# Patient Record
Sex: Male | Born: 1955 | Race: White | Hispanic: No | Marital: Married | Smoking: Never smoker
Health system: Southern US, Community
[De-identification: ages and names within clinical notes are randomized; demographics above are authoritative.]

## PROBLEM LIST (undated history)

## (undated) DIAGNOSIS — N138 Other obstructive and reflux uropathy: Secondary | ICD-10-CM

## (undated) DIAGNOSIS — I10 Essential (primary) hypertension: Secondary | ICD-10-CM

## (undated) DIAGNOSIS — G459 Transient cerebral ischemic attack, unspecified: Secondary | ICD-10-CM

## (undated) DIAGNOSIS — L989 Disorder of the skin and subcutaneous tissue, unspecified: Secondary | ICD-10-CM

## (undated) DIAGNOSIS — J309 Allergic rhinitis, unspecified: Secondary | ICD-10-CM

## (undated) DIAGNOSIS — M25569 Pain in unspecified knee: Secondary | ICD-10-CM

## (undated) DIAGNOSIS — T7840XA Allergy, unspecified, initial encounter: Secondary | ICD-10-CM

## (undated) DIAGNOSIS — F528 Other sexual dysfunction not due to a substance or known physiological condition: Secondary | ICD-10-CM

## (undated) DIAGNOSIS — E785 Hyperlipidemia, unspecified: Secondary | ICD-10-CM

## (undated) DIAGNOSIS — N401 Enlarged prostate with lower urinary tract symptoms: Secondary | ICD-10-CM

## (undated) DIAGNOSIS — H109 Unspecified conjunctivitis: Secondary | ICD-10-CM

## (undated) DIAGNOSIS — R972 Elevated prostate specific antigen [PSA]: Secondary | ICD-10-CM

## (undated) DIAGNOSIS — N764 Abscess of vulva: Secondary | ICD-10-CM

## (undated) DIAGNOSIS — F419 Anxiety disorder, unspecified: Secondary | ICD-10-CM

## (undated) DIAGNOSIS — R39198 Other difficulties with micturition: Secondary | ICD-10-CM

## (undated) HISTORY — DX: Pain in unspecified knee: M25.569

## (undated) HISTORY — DX: Other difficulties with micturition: R39.198

## (undated) HISTORY — PX: PROSTATE BIOPSY: SHX241

## (undated) HISTORY — PX: GANGLION CYST EXCISION: SHX1691

## (undated) HISTORY — PX: TONSILLECTOMY: SHX5217

## (undated) HISTORY — DX: Other obstructive and reflux uropathy: N13.8

## (undated) HISTORY — DX: Essential (primary) hypertension: I10

## (undated) HISTORY — DX: Transient cerebral ischemic attack, unspecified: G45.9

## (undated) HISTORY — PX: VASECTOMY: SHX75

## (undated) HISTORY — DX: Hyperlipidemia, unspecified: E78.5

## (undated) HISTORY — DX: Abscess of vulva: N76.4

## (undated) HISTORY — DX: Elevated prostate specific antigen (PSA): R97.20

## (undated) HISTORY — DX: Allergic rhinitis, unspecified: J30.9

## (undated) HISTORY — DX: Anxiety disorder, unspecified: F41.9

## (undated) HISTORY — DX: Allergy, unspecified, initial encounter: T78.40XA

## (undated) HISTORY — DX: Unspecified conjunctivitis: H10.9

## (undated) HISTORY — DX: Benign prostatic hyperplasia with lower urinary tract symptoms: N40.1

## (undated) HISTORY — DX: Disorder of the skin and subcutaneous tissue, unspecified: L98.9

## (undated) HISTORY — DX: Other sexual dysfunction not due to a substance or known physiological condition: F52.8

---

## 1995-06-23 HISTORY — PX: NASAL SINUS SURGERY: SHX719

## 1998-06-06 ENCOUNTER — Other Ambulatory Visit: Admission: RE | Admit: 1998-06-06 | Discharge: 1998-06-06 | Payer: Self-pay | Admitting: Orthopedic Surgery

## 1998-10-01 ENCOUNTER — Encounter: Payer: Self-pay | Admitting: Internal Medicine

## 1998-10-01 ENCOUNTER — Ambulatory Visit (HOSPITAL_COMMUNITY): Admission: RE | Admit: 1998-10-01 | Discharge: 1998-10-01 | Payer: Self-pay | Admitting: Internal Medicine

## 2004-02-06 ENCOUNTER — Ambulatory Visit (HOSPITAL_COMMUNITY): Admission: RE | Admit: 2004-02-06 | Discharge: 2004-02-06 | Payer: Self-pay | Admitting: Internal Medicine

## 2004-04-16 ENCOUNTER — Ambulatory Visit: Payer: Self-pay | Admitting: Internal Medicine

## 2004-08-07 ENCOUNTER — Ambulatory Visit: Payer: Self-pay | Admitting: Internal Medicine

## 2005-12-04 ENCOUNTER — Ambulatory Visit: Payer: Self-pay | Admitting: Internal Medicine

## 2005-12-10 ENCOUNTER — Ambulatory Visit: Payer: Self-pay | Admitting: Internal Medicine

## 2005-12-29 ENCOUNTER — Ambulatory Visit: Payer: Self-pay | Admitting: Internal Medicine

## 2006-09-15 ENCOUNTER — Ambulatory Visit: Payer: Self-pay | Admitting: Internal Medicine

## 2006-09-15 LAB — CONVERTED CEMR LAB
ALT: 19 units/L (ref 0–40)
AST: 20 units/L (ref 0–37)
Albumin: 3.8 g/dL (ref 3.5–5.2)
Alkaline Phosphatase: 40 units/L (ref 39–117)
BUN: 11 mg/dL (ref 6–23)
Basophils Absolute: 0.1 10*3/uL (ref 0.0–0.1)
Basophils Relative: 0.7 % (ref 0.0–1.0)
Bilirubin Urine: NEGATIVE
Bilirubin, Direct: 0.1 mg/dL (ref 0.0–0.3)
CO2: 31 meq/L (ref 19–32)
Calcium: 9.1 mg/dL (ref 8.4–10.5)
Chloride: 109 meq/L (ref 96–112)
Cholesterol: 193 mg/dL (ref 0–200)
Creatinine, Ser: 0.9 mg/dL (ref 0.4–1.5)
Crystals: NEGATIVE
Eosinophils Absolute: 0.2 10*3/uL (ref 0.0–0.6)
Eosinophils Relative: 2.4 % (ref 0.0–5.0)
GFR calc Af Amer: 115 mL/min
GFR calc non Af Amer: 95 mL/min
Glucose, Bld: 105 mg/dL — ABNORMAL HIGH (ref 70–99)
HCT: 41.9 % (ref 39.0–52.0)
HDL: 49 mg/dL (ref >39.0)
Hemoglobin: 14.7 g/dL (ref 13.0–17.0)
Ketones, ur: NEGATIVE mg/dL
LDL Cholesterol: 131 mg/dL — ABNORMAL HIGH (ref 0–99)
Lymphocytes Relative: 19.6 % (ref 12.0–46.0)
MCHC: 35 g/dL (ref 30.0–36.0)
MCV: 94.9 fL (ref 78.0–100.0)
Monocytes Absolute: 0.7 10*3/uL (ref 0.2–0.7)
Monocytes Relative: 7.7 % (ref 3.0–11.0)
Mucus, UA: NEGATIVE
Neutro Abs: 6.5 10*3/uL (ref 1.4–7.7)
Neutrophils Relative %: 69.6 % (ref 43.0–77.0)
Nitrite: NEGATIVE
PSA: 3.4 ng/mL (ref 0.10–4.00)
Platelets: 258 10*3/uL (ref 150–400)
Potassium: 4 meq/L (ref 3.5–5.1)
RBC: 4.42 M/uL (ref 4.22–5.81)
RDW: 12.1 % (ref 11.5–14.6)
Sodium: 145 meq/L (ref 135–145)
Specific Gravity, Urine: 1.02 (ref 1.000–1.03)
Squamous Epithelial / HPF: NEGATIVE /lpf
TSH: 0.95 microintl units/mL (ref 0.35–5.50)
Total Bilirubin: 1.1 mg/dL (ref 0.3–1.2)
Total CHOL/HDL Ratio: 3.9
Total Protein, Urine: NEGATIVE mg/dL
Total Protein: 6.4 g/dL (ref 6.0–8.3)
Triglycerides: 67 mg/dL (ref 0–149)
Urine Glucose: NEGATIVE mg/dL
Urobilinogen, UA: 0.2 (ref 0.0–1.0)
VLDL: 13 mg/dL (ref 0–40)
WBC: 9.3 10*3/uL (ref 4.5–10.5)
pH: 6 (ref 5.0–8.0)

## 2006-09-21 ENCOUNTER — Ambulatory Visit: Payer: Self-pay | Admitting: Internal Medicine

## 2006-11-01 ENCOUNTER — Ambulatory Visit: Payer: Self-pay | Admitting: Internal Medicine

## 2006-12-02 ENCOUNTER — Ambulatory Visit: Payer: Self-pay | Admitting: Internal Medicine

## 2006-12-02 HISTORY — PX: COLONOSCOPY: SHX174

## 2006-12-02 LAB — HM COLONOSCOPY

## 2007-02-15 ENCOUNTER — Encounter: Payer: Self-pay | Admitting: Internal Medicine

## 2007-02-15 DIAGNOSIS — J309 Allergic rhinitis, unspecified: Secondary | ICD-10-CM | POA: Insufficient documentation

## 2007-02-15 HISTORY — DX: Allergic rhinitis, unspecified: J30.9

## 2007-11-03 ENCOUNTER — Ambulatory Visit: Payer: Self-pay | Admitting: Internal Medicine

## 2007-11-03 LAB — CONVERTED CEMR LAB
ALT: 18 units/L (ref 0–53)
AST: 20 units/L (ref 0–37)
Albumin: 4.3 g/dL (ref 3.5–5.2)
Alkaline Phosphatase: 47 units/L (ref 39–117)
BUN: 10 mg/dL (ref 6–23)
Basophils Absolute: 0.1 10*3/uL (ref 0.0–0.1)
Basophils Relative: 1 % (ref 0.0–1.0)
Bilirubin Urine: NEGATIVE
Bilirubin, Direct: 0.1 mg/dL (ref 0.0–0.3)
CO2: 31 meq/L (ref 19–32)
Calcium: 9.3 mg/dL (ref 8.4–10.5)
Chloride: 106 meq/L (ref 96–112)
Cholesterol: 132 mg/dL (ref 0–200)
Creatinine, Ser: 0.9 mg/dL (ref 0.4–1.5)
Eosinophils Absolute: 0.2 10*3/uL (ref 0.0–0.7)
Eosinophils Relative: 2.1 % (ref 0.0–5.0)
GFR calc Af Amer: 114 mL/min
GFR calc non Af Amer: 95 mL/min
Glucose, Bld: 109 mg/dL — ABNORMAL HIGH (ref 70–99)
HCT: 44.1 % (ref 39.0–52.0)
HDL: 41.9 mg/dL (ref >39.0)
Hemoglobin, Urine: NEGATIVE
Hemoglobin: 14.9 g/dL (ref 13.0–17.0)
Ketones, ur: NEGATIVE mg/dL
LDL Cholesterol: 81 mg/dL (ref 0–99)
Leukocytes, UA: NEGATIVE
Lymphocytes Relative: 25.6 % (ref 12.0–46.0)
MCHC: 33.7 g/dL (ref 30.0–36.0)
MCV: 95.2 fL (ref 78.0–100.0)
Monocytes Absolute: 0.6 10*3/uL (ref 0.1–1.0)
Monocytes Relative: 8.2 % (ref 3.0–12.0)
Neutro Abs: 5 10*3/uL (ref 1.4–7.7)
Neutrophils Relative %: 63.1 % (ref 43.0–77.0)
Nitrite: NEGATIVE
PSA: 4.5 ng/mL — ABNORMAL HIGH (ref 0.10–4.00)
Platelets: 236 10*3/uL (ref 150–400)
Potassium: 4.5 meq/L (ref 3.5–5.1)
RBC: 4.63 M/uL (ref 4.22–5.81)
RDW: 12.2 % (ref 11.5–14.6)
Sodium: 142 meq/L (ref 135–145)
Specific Gravity, Urine: 1.015 (ref 1.000–1.03)
TSH: 0.89 microintl units/mL (ref 0.35–5.50)
Total Bilirubin: 1.2 mg/dL (ref 0.3–1.2)
Total CHOL/HDL Ratio: 3.2
Total Protein, Urine: NEGATIVE mg/dL
Total Protein: 6.8 g/dL (ref 6.0–8.3)
Triglycerides: 48 mg/dL (ref 0–149)
Urine Glucose: NEGATIVE mg/dL
Urobilinogen, UA: 1 (ref 0.0–1.0)
VLDL: 10 mg/dL (ref 0–40)
WBC: 7.9 10*3/uL (ref 4.5–10.5)
pH: 7 (ref 5.0–8.0)

## 2007-11-08 ENCOUNTER — Ambulatory Visit: Payer: Self-pay | Admitting: Internal Medicine

## 2007-11-08 DIAGNOSIS — E785 Hyperlipidemia, unspecified: Secondary | ICD-10-CM

## 2007-11-08 DIAGNOSIS — R972 Elevated prostate specific antigen [PSA]: Secondary | ICD-10-CM | POA: Insufficient documentation

## 2007-11-08 HISTORY — DX: Elevated prostate specific antigen (PSA): R97.20

## 2007-11-08 HISTORY — DX: Hyperlipidemia, unspecified: E78.5

## 2008-01-30 ENCOUNTER — Encounter: Payer: Self-pay | Admitting: Internal Medicine

## 2008-02-24 ENCOUNTER — Encounter: Payer: Self-pay | Admitting: Internal Medicine

## 2008-09-04 ENCOUNTER — Ambulatory Visit: Payer: Self-pay | Admitting: Internal Medicine

## 2008-09-04 DIAGNOSIS — R39198 Other difficulties with micturition: Secondary | ICD-10-CM | POA: Insufficient documentation

## 2008-09-04 DIAGNOSIS — H109 Unspecified conjunctivitis: Secondary | ICD-10-CM | POA: Insufficient documentation

## 2008-09-04 HISTORY — DX: Unspecified conjunctivitis: H10.9

## 2008-09-04 HISTORY — DX: Other difficulties with micturition: R39.198

## 2008-09-05 LAB — CONVERTED CEMR LAB

## 2008-09-26 ENCOUNTER — Ambulatory Visit: Payer: Self-pay | Admitting: Internal Medicine

## 2008-09-26 DIAGNOSIS — F528 Other sexual dysfunction not due to a substance or known physiological condition: Secondary | ICD-10-CM | POA: Insufficient documentation

## 2008-09-26 HISTORY — DX: Other sexual dysfunction not due to a substance or known physiological condition: F52.8

## 2009-11-27 ENCOUNTER — Ambulatory Visit: Payer: Self-pay | Admitting: Internal Medicine

## 2009-11-27 DIAGNOSIS — N764 Abscess of vulva: Secondary | ICD-10-CM

## 2009-11-27 HISTORY — DX: Abscess of vulva: N76.4

## 2009-11-27 LAB — CONVERTED CEMR LAB
ALT: 20 units/L (ref 0–53)
AST: 22 units/L (ref 0–37)
Albumin: 4.4 g/dL (ref 3.5–5.2)
Alkaline Phosphatase: 43 units/L (ref 39–117)
BUN: 16 mg/dL (ref 6–23)
Basophils Absolute: 0 10*3/uL (ref 0.0–0.1)
Basophils Relative: 0.6 % (ref 0.0–3.0)
Bilirubin Urine: NEGATIVE
Bilirubin, Direct: 0.2 mg/dL (ref 0.0–0.3)
CO2: 32 meq/L (ref 19–32)
Calcium: 9.5 mg/dL (ref 8.4–10.5)
Chloride: 103 meq/L (ref 96–112)
Cholesterol: 164 mg/dL (ref 0–200)
Creatinine, Ser: 0.9 mg/dL (ref 0.4–1.5)
Eosinophils Absolute: 0.2 10*3/uL (ref 0.0–0.7)
Eosinophils Relative: 3.9 % (ref 0.0–5.0)
GFR calc non Af Amer: 97.5 mL/min (ref >60)
Glucose, Bld: 93 mg/dL (ref 70–99)
HCT: 43 % (ref 39.0–52.0)
HDL: 51.9 mg/dL (ref >39.00)
Hemoglobin, Urine: NEGATIVE
Hemoglobin: 15.3 g/dL (ref 13.0–17.0)
Ketones, ur: NEGATIVE mg/dL
LDL Cholesterol: 104 mg/dL — ABNORMAL HIGH (ref 0–99)
Lymphocytes Relative: 32.5 % (ref 12.0–46.0)
Lymphs Abs: 2 10*3/uL (ref 0.7–4.0)
MCHC: 35.5 g/dL (ref 30.0–36.0)
MCV: 96 fL (ref 78.0–100.0)
Monocytes Absolute: 0.6 10*3/uL (ref 0.1–1.0)
Monocytes Relative: 9.3 % (ref 3.0–12.0)
Neutro Abs: 3.4 10*3/uL (ref 1.4–7.7)
Neutrophils Relative %: 53.7 % (ref 43.0–77.0)
Nitrite: NEGATIVE
PSA: 5.45 ng/mL — ABNORMAL HIGH (ref 0.10–4.00)
Platelets: 204 10*3/uL (ref 150.0–400.0)
Potassium: 5 meq/L (ref 3.5–5.1)
RBC: 4.48 M/uL (ref 4.22–5.81)
RDW: 12.6 % (ref 11.5–14.6)
Sodium: 140 meq/L (ref 135–145)
Specific Gravity, Urine: 1.01 (ref 1.000–1.030)
TSH: 1.23 microintl units/mL (ref 0.35–5.50)
Total Bilirubin: 1.2 mg/dL (ref 0.3–1.2)
Total CHOL/HDL Ratio: 3
Total Protein, Urine: NEGATIVE mg/dL
Total Protein: 6.7 g/dL (ref 6.0–8.3)
Triglycerides: 42 mg/dL (ref 0.0–149.0)
Urine Glucose: NEGATIVE mg/dL
Urobilinogen, UA: 0.2 (ref 0.0–1.0)
VLDL: 8.4 mg/dL (ref 0.0–40.0)
WBC: 6.3 10*3/uL (ref 4.5–10.5)
pH: 7.5 (ref 5.0–8.0)

## 2009-11-29 ENCOUNTER — Ambulatory Visit: Payer: Self-pay | Admitting: Internal Medicine

## 2009-11-29 DIAGNOSIS — L989 Disorder of the skin and subcutaneous tissue, unspecified: Secondary | ICD-10-CM

## 2009-11-29 DIAGNOSIS — M25569 Pain in unspecified knee: Secondary | ICD-10-CM

## 2009-11-29 HISTORY — DX: Disorder of the skin and subcutaneous tissue, unspecified: L98.9

## 2009-11-29 HISTORY — DX: Pain in unspecified knee: M25.569

## 2009-12-30 ENCOUNTER — Telehealth: Payer: Self-pay | Admitting: Internal Medicine

## 2010-01-03 ENCOUNTER — Encounter: Payer: Self-pay | Admitting: Internal Medicine

## 2010-01-23 ENCOUNTER — Encounter: Payer: Self-pay | Admitting: Internal Medicine

## 2010-07-22 NOTE — Letter (Signed)
Summary: [facility]   Imported By: Lester [place] 01/08/2010 08:23:19  _____________________________________________________________________  External Attachment:    Type:   Image     Comment:   External Document

## 2010-07-22 NOTE — Letter (Signed)
Summary: [facility]   Imported By: Sherian Rein 01/29/2010 10:57:37  _____________________________________________________________________  External Attachment:    Type:   Image     Comment:   External Document

## 2010-07-22 NOTE — Progress Notes (Signed)
Summary: rx's  Phone Note Call from Patient Call back at Home Phone [phone] Call back at Work Phone [phone]   Summary of Call: Patient left message on triage that he lost the prescriptions given at [facility] 2 weeks ago. Patient would like to pick up new ones. Please advise. Initial call taken by: Lucious Groves,  December 30, 2009 4:29 PM  Follow-up for Phone Call        ok for all, as there are no controlled substances - to [place] to [place] Follow-up by: Corwin Levins MD,  December 30, 2009 5:24 PM    Prescriptions: PRAVACHOL 20 MG  TABS (PRAVASTATIN SODIUM) 1 by mouth once daily  #90 x 3   Entered by:   Zella Ball Ewing CMA (AAMA)   Authorized by:   Corwin Levins MD   Signed by:   Scharlene Gloss CMA (AAMA) on 12/31/2009   Method used:   Print then Give to Patient   RxID:   [id number] MELOXICAM 15 MG TABS (MELOXICAM) 1 by mouth at bedtime  #90 x 3   Entered by:   Zella Ball Ewing CMA (AAMA)   Authorized by:   Corwin Levins MD   Signed by:   Scharlene Gloss CMA (AAMA) on 12/31/2009   Method used:   Print then Give to Patient   RxID:   [id number] CIALIS 20 MG TABS (TADALAFIL) 1po every other day as needed  #90 x 3   Entered by:   Zella Ball Ewing CMA (AAMA)   Authorized by:   Corwin Levins MD   Signed by:   Scharlene Gloss CMA (AAMA) on 12/31/2009   Method used:   Print then Give to Patient   RxID:   [id number] CETIRIZINE HCL 10 MG TABS (CETIRIZINE HCL) 1 by mouth once daily as needed  #90 x 3   Entered by:   Zella Ball Ewing CMA (AAMA)   Authorized by:   Corwin Levins MD   Signed by:   Scharlene Gloss CMA (AAMA) on 12/31/2009   Method used:   Print then Give to Patient   RxID:   [id number] TAMSULOSIN HCL 0.4 MG CAPS (TAMSULOSIN HCL) 1 by mouth once daily  #90 x 3   Entered by:   Zella Ball Ewing CMA (AAMA)   Authorized by:   Corwin Levins MD   Signed by:   Scharlene Gloss CMA (AAMA) on 12/31/2009   Method used:   Print then Give to Patient   RxID:   [id number] ACYCLOVIR 200 MG  CAPS (ACYCLOVIR)  1 by mouth two times a day  #180 x 3   Entered by:   Zella Ball Ewing CMA (AAMA)   Authorized by:   Corwin Levins MD   Signed by:   Scharlene Gloss CMA (AAMA) on 12/31/2009   Method used:   Print then Give to Patient   RxID:   [id number]   Appended Document: rx's called pt. at home number and work number, left msg. to call back and informed prescriptions ready for pickup.  Appended Document: rx's called pt left msg. to call back  Appended Document: rx's called pt informed prescriptions are ready to be picked up at the front desk.

## 2010-07-22 NOTE — Assessment & Plan Note (Signed)
Summary: [facility]  #   Vital Signs:  Patient profile:   55 year old male Height:      71 inches Weight:      184.75 pounds BMI:     25.86 O2 Sat:      96 % on Room air Temp:     98.2 degrees F oral Pulse rate:   84 / minute BP sitting:   118 / 80  (left arm) Cuff size:   regular  Vitals Entered ByZella Ball Ewing (November 29, 2009 11:04 AM)  O2 Flow:  Room air  CC: Adult Physical/RE   CC:  Adult Physical/RE.  History of Present Illness: lost 6 lbs since last seen; overall doing well, no complains;  Pt denies CP, sob, doe, wheezing, orthopnea, pnd, worsening LE edema, palps, dizziness or syncope  Pt denies new neuro symptoms such as headache, facial or extremity weakness  Does have mild knee aching intermittent without swelling or gait change.    Preventive Screening-Counseling & Management      Drug Use:  no.    Problems Prior to Update: 1)  Knee Pain, Bilateral  (ICD-719.46) 2)  Skin Lesion  (ICD-709.9) 3)  Vulvar Abscess  (ICD-616.4) 4)  Erectile Dysfunction  (ICD-302.72) 5)  Sexually Transmitted Disease, Exposure To  (ICD-V01.6) 6)  Slowing of Urinary Stream  (ICD-788.62) 7)  Conjunctivitis, Infectious  (ICD-372.30) 8)  Psa, Increased  (ICD-790.93) 9)  Preventive Health Care  (ICD-V70.0) 10)  Hyperlipidemia  (ICD-272.4) 11)  Allergic Rhinitis  (ICD-477.9)  Medications Prior to Update: 1)  Pravachol 20 Mg  Tabs (Pravastatin Sodium) .[name].. 1 By Mouth Qd 2)  Adult Aspirin Ec Low Strength 81 Mg  Tbec (Aspirin) .[name].. 1 By Mouth Qd 3)  Acyclovir 200 Mg  Caps (Acyclovir) .[name].. 1 By Mouth Bid 4)  Flomax 0.4 Mg Xr24h-Cap (Tamsulosin Hcl) .[name].. 1 By Mouth Once Daily (Generic) 5)  Cetirizine Hcl 10 Mg Tabs (Cetirizine Hcl) .[name].. 1 By Mouth Once Daily As Needed 6)  Cialis 20 Mg Tabs (Tadalafil) .[name].. 1po Every Other Day As Needed  Current Medications (verified): 1)  Pravachol 20 Mg  Tabs (Pravastatin Sodium) .[name].. 1 By Mouth Once Daily 2)  Adult Aspirin Ec Low Strength 81 Mg  Tbec  (Aspirin) .[name].. 1 By Mouth Qd 3)  Acyclovir 200 Mg  Caps (Acyclovir) .[name].. 1 By Mouth Two Times A Day 4)  Tamsulosin Hcl 0.4 Mg Caps (Tamsulosin Hcl) .[name].. 1 By Mouth Once Daily 5)  Cetirizine Hcl 10 Mg Tabs (Cetirizine Hcl) .[name].. 1 By Mouth Once Daily As Needed 6)  Cialis 20 Mg Tabs (Tadalafil) .[name].. 1po Every Other Day As Needed 7)  Meloxicam 15 Mg Tabs (Meloxicam) .[name].. 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 adopted - Non-contributory  Social History: Reviewed history from 11/08/2007 and no changes required. 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         all otherwise negative per pt -    Physical Exam  General:  alert and well-developed.   Head:  normocephalic and atraumatic.   Eyes:  vision grossly intact, pupils equal, and pupils round.   Ears:  R  ear normal and L ear normal.   Nose:  no external deformity and no nasal discharge.   Mouth:  no gingival abnormalities and pharynx pink and moist.   Neck:  supple and no masses.   Lungs:  normal respiratory effort and normal breath sounds.   Heart:  normal rate and regular rhythm.   Abdomen:  soft, non-tender, and normal bowel sounds.   Msk:  no joint tenderness and no joint swelling.   Extremities:  no edema, no erythema  Neurologic:  cranial nerves II-XII intact and strength normal in all extremities.   Skin:  just over half cm non raised dark somewhat irreg lesion to left upper back/trapezoid area Psych:  not depressed appearing and slightly anxious.     Impression & Recommendations:  Problem # 1:  Preventive Health Care (ICD-V70.0)  Overall doing well, age appropriate education and counseling updated and referral for appropriate preventive services done unless declined, immunizations up to date or declined, diet counseling done if overweight, urged to quit smoking if smokes , most recent labs reviewed and current ordered if appropriate, ecg reviewed or declined (interpretation per ECG scanned in the EMR if done); information regarding Medicare Prevention requirements given if appropriate; speciality referrals updated as appropriate   Orders: EKG w/ Interpretation (93000)  Problem # 2:  PSA, INCREASED (ICD-790.93)  needs f/u - pt prefers dr Isabel Caprice  Orders: Urology Referral (Urology)  Problem # 3:  SKIN LESION (ICD-709.9)  refer derm - mid upper back lesion  Orders: Dermatology Referral (Derma)  Problem # 4:  KNEE PAIN, BILATERAL (ICD-719.46)  His updated medication list for this problem includes:    Adult Aspirin Ec Low Strength 81 Mg Tbec (Aspirin) .[name]... 1 by mouth qd    Meloxicam 15 Mg Tabs (Meloxicam) .[name]... 1 by mouth at bedtime mild, nonspecific aching, still active/running - ok for tylenol arthritis as needed, and meloxicam 15 as needed   Complete Medication  List: 1)  Pravachol 20 Mg Tabs (Pravastatin sodium) .[name].. 1 by mouth once daily 2)  Adult Aspirin Ec Low Strength 81 Mg Tbec (Aspirin) .[name].. 1 by mouth qd 3)  Acyclovir 200 Mg Caps (Acyclovir) .[name].. 1 by mouth two times a day 4)  Tamsulosin Hcl 0.4 Mg Caps (Tamsulosin hcl) .[name].. 1 by mouth once daily 5)  Cetirizine Hcl 10 Mg Tabs (Cetirizine hcl) .[name].. 1 by mouth once daily as needed 6)  Cialis 20 Mg Tabs (Tadalafil) .[name].. 1po every other day as needed 7)  Meloxicam 15 Mg Tabs (Meloxicam) .[name].. 1 by mouth at bedtime  Other Orders: Tdap => 45yrs IM (16109) Admin 1st Vaccine (60454)  Patient Instructions: 1)  You will be contacted about the referral(s) to: referral to Dr Isabel Caprice, and dermatology 2)  Continue all previous medications as before this visit  3)  you had the tetanus shot today 4)  Please take all new medications as prescribed  5)  Continue all previous medications  as before this visit  6)  Please schedule a follow-up appointment in 1 year or sooner if needed Prescriptions: MELOXICAM 15 MG TABS (MELOXICAM) 1 by mouth at bedtime  #90 x 3   Entered and Authorized by:   Corwin Levins MD   Signed by:   Corwin Levins MD on 11/29/2009   Method used:   Print then Give to Patient   RxID:   [id number] CIALIS 20 MG TABS (TADALAFIL) 1po every other day as needed  #90 x 3   Entered and Authorized by:   Corwin Levins MD   Signed by:   Corwin Levins MD on 11/29/2009   Method used:   Print then Give to Patient   RxID:   [id number] CETIRIZINE HCL 10 MG TABS (CETIRIZINE HCL) 1 by mouth once daily as needed  #90 x 3   Entered and Authorized by:   Corwin Levins MD   Signed by:   Corwin Levins MD on 11/29/2009   Method used:   Print then Give to Patient   RxID:   [id number] TAMSULOSIN HCL 0.4 MG CAPS (TAMSULOSIN HCL) 1 by mouth once daily  #90 x 3   Entered and Authorized by:   Corwin Levins MD   Signed by:   Corwin Levins MD on 11/29/2009   Method used:   Print then Give to Patient    RxID:   [id number] ACYCLOVIR 200 MG  CAPS (ACYCLOVIR) 1 by mouth two times a day  #180 x 3   Entered and Authorized by:   Corwin Levins MD   Signed by:   Corwin Levins MD on 11/29/2009   Method used:   Print then Give to Patient   RxID:   [id number] PRAVACHOL 20 MG  TABS (PRAVASTATIN SODIUM) 1 by mouth once daily  #90 x 3   Entered and Authorized by:   Corwin Levins MD   Signed by:   Corwin Levins MD on 11/29/2009   Method used:   Print then Give to Patient   RxID:   [id number]    Immunizations Administered:  Tetanus Vaccine:    Vaccine Type: Tdap    Site: left deltoid    Mfr: Gl[employer]oSmithK[employer]    Dose: 0.5 ml    Route: IM    Given by: Robin Ewing    Exp. Date: 04/17/2010    Lot #: [id number]    VIS given: 05/10/07 version given November 29, 2009.

## 2010-11-26 ENCOUNTER — Other Ambulatory Visit: Payer: Self-pay | Admitting: Internal Medicine

## 2010-12-22 ENCOUNTER — Other Ambulatory Visit: Payer: Self-pay

## 2010-12-22 ENCOUNTER — Other Ambulatory Visit: Payer: Self-pay | Admitting: Internal Medicine

## 2010-12-22 DIAGNOSIS — Z0000 Encounter for general adult medical examination without abnormal findings: Secondary | ICD-10-CM

## 2010-12-22 DIAGNOSIS — Z0389 Encounter for observation for other suspected diseases and conditions ruled out: Secondary | ICD-10-CM

## 2010-12-23 ENCOUNTER — Encounter: Payer: Self-pay | Admitting: Internal Medicine

## 2010-12-23 ENCOUNTER — Other Ambulatory Visit (INDEPENDENT_AMBULATORY_CARE_PROVIDER_SITE_OTHER): Payer: Federal, State, Local not specified - PPO

## 2010-12-23 DIAGNOSIS — Z0000 Encounter for general adult medical examination without abnormal findings: Secondary | ICD-10-CM

## 2010-12-23 DIAGNOSIS — Z0389 Encounter for observation for other suspected diseases and conditions ruled out: Secondary | ICD-10-CM

## 2010-12-23 LAB — LIPID PANEL
LDL Cholesterol: 75 mg/dL (ref 0–99)
VLDL: 13 mg/dL (ref 0.0–40.0)

## 2010-12-23 LAB — CBC WITH DIFFERENTIAL/PLATELET
Eosinophils Relative: 5.8 % — ABNORMAL HIGH (ref 0.0–5.0)
HCT: 41.6 % (ref 39.0–52.0)
Lymphocytes Relative: 26.7 % (ref 12.0–46.0)
Lymphs Abs: 1.7 10*3/uL (ref 0.7–4.0)
Monocytes Relative: 9.1 % (ref 3.0–12.0)
Platelets: 193 10*3/uL (ref 150.0–400.0)
WBC: 6.4 10*3/uL (ref 4.5–10.5)

## 2010-12-23 LAB — HEPATIC FUNCTION PANEL
ALT: 22 U/L (ref 0–53)
Total Bilirubin: 0.9 mg/dL (ref 0.3–1.2)

## 2010-12-23 LAB — BASIC METABOLIC PANEL
BUN: 19 mg/dL (ref 6–23)
Chloride: 109 mEq/L (ref 96–112)
Creatinine, Ser: 0.8 mg/dL (ref 0.4–1.5)
Glucose, Bld: 83 mg/dL (ref 70–99)
Potassium: 4.5 mEq/L (ref 3.5–5.1)

## 2010-12-23 LAB — URINALYSIS
Hgb urine dipstick: NEGATIVE
Nitrite: NEGATIVE
Urobilinogen, UA: 0.2 (ref 0.0–1.0)

## 2010-12-23 LAB — PSA: PSA: 5.63 ng/mL — ABNORMAL HIGH (ref 0.10–4.00)

## 2010-12-23 LAB — TSH: TSH: 1.1 u[IU]/mL (ref 0.35–5.50)

## 2010-12-28 ENCOUNTER — Encounter: Payer: Self-pay | Admitting: Internal Medicine

## 2010-12-28 DIAGNOSIS — Z0001 Encounter for general adult medical examination with abnormal findings: Secondary | ICD-10-CM | POA: Insufficient documentation

## 2010-12-28 DIAGNOSIS — Z0000 Encounter for general adult medical examination without abnormal findings: Secondary | ICD-10-CM | POA: Insufficient documentation

## 2010-12-29 ENCOUNTER — Encounter: Payer: Self-pay | Admitting: Internal Medicine

## 2010-12-29 ENCOUNTER — Ambulatory Visit (INDEPENDENT_AMBULATORY_CARE_PROVIDER_SITE_OTHER): Payer: Federal, State, Local not specified - PPO | Admitting: Internal Medicine

## 2010-12-29 VITALS — BP 112/82 | HR 78 | Temp 97.3°F | Ht 72.0 in | Wt 185.2 lb

## 2010-12-29 DIAGNOSIS — Z0000 Encounter for general adult medical examination without abnormal findings: Secondary | ICD-10-CM

## 2010-12-29 MED ORDER — ALPRAZOLAM 0.25 MG PO TABS: 0.2500 mg | ORAL_TABLET | Freq: Every day | ORAL | Status: DC | PRN

## 2010-12-29 MED ORDER — CETIRIZINE HCL 10 MG PO TABS: 10.0000 mg | ORAL_TABLET | Freq: Every day | ORAL | Status: DC

## 2010-12-29 MED ORDER — TAMSULOSIN HCL 0.4 MG PO CAPS: 0.4000 mg | ORAL_CAPSULE | Freq: Every day | ORAL | Status: DC

## 2010-12-29 NOTE — Progress Notes (Signed)
Subjective:    Patient ID: Peter Leblanc, male    DOB: 1956-02-09, 55 y.o.   MRN: [id number]  HPI  Here for wellness and f/u;  Overall doing ok;  Pt denies CP, worsening SOB, DOE, wheezing, orthopnea, PND, worsening LE edema, palpitations, dizziness or syncope.  Pt denies neurological change such as new Headache, facial or extremity weakness.  Pt denies polydipsia, polyuria, or low sugar symptoms. Pt states overall good compliance with treatment and medications, good tolerability, and trying to follow lower cholesterol diet.  Pt denies worsening depressive symptoms, suicidal ideation or panic. No fever, wt loss, night sweats, loss of appetite, or other constitutional symptoms.  Pt states good ability with ADL's, low fall risk, home safety reviewed and adequate, no significant changes in hearing or vision, and occasionally active with exercise.  Peter Leblanc quite a bit, has been cutting back on red meat.  Denies worsening depressive symptoms, suicidal ideation, or panic, though has ongoing anxiety, some increased recently, divorcing and son in the [place].  Past Medical History  Diagnosis Date  . ALLERGIC RHINITIS 02/15/2007  . CONJUNCTIVITIS, INFECTIOUS 09/04/2008  . ERECTILE DYSFUNCTION 09/26/2008  . HYPERLIPIDEMIA 11/08/2007  . KNEE PAIN, BILATERAL 11/29/2009  . PSA, INCREASED 11/08/2007  . SKIN LESION 11/29/2009  . Slowing of urinary stream 09/04/2008  . VULVAR ABSCESS 11/27/2009  . Genital herpes    Past Surgical History  Procedure Date  . Tonsillectomy   . Nasal sinus surgery 1997  . Vasectomy   . Ganglion cyst excision     reports that he has never smoked. He does not have any smokeless tobacco history on file. He reports that he drinks alcohol. He reports that he does not use illicit drugs. family history is not on file.  He is adopted. No Known Allergies Current Outpatient Prescriptions on File Prior to Visit  Medication Sig Dispense Refill  . acyclovir (ZOVIRAX) 200 MG capsule Take by mouth 2  (two) times daily.        [name] aspirin 81 MG EC tablet Take 81 mg by mouth daily.        . meloxicam (MOBIC) 15 MG tablet TAKE  ONE TABLET BY MOUTH NIGHTLY AT BEDTIME  30 tablet  0  . pravastatin (PRAVACHOL) 20 MG tablet TAKE ONE TABLET BY MOUTH ONE TIME DAILY  30 tablet  0  . Tamsulosin HCl (FLOMAX) 0.4 MG CAPS Take by mouth daily.        . tadalafil (CIALIS) 20 MG tablet Take 20 mg by mouth daily as needed.         Review of Systems Review of Systems  Constitutional: Negative for diaphoresis, activity change, appetite change and unexpected weight change.  HENT: Negative for hearing loss, ear pain, facial swelling, mouth sores and neck stiffness.   Eyes: Negative for pain, redness and visual disturbance.  Respiratory: Negative for shortness of breath and wheezing.   Cardiovascular: Negative for chest pain and palpitations.  Gastrointestinal: Negative for diarrhea, blood in stool, abdominal distention and rectal pain.  Genitourinary: Negative for hematuria, flank pain and decreased urine volume.  Musculoskeletal: Negative for myalgias and joint swelling.  Skin: Negative for color change and wound.  Neurological: Negative for syncope and numbness.  Hematological: Negative for adenopathy.  Psychiatric/Behavioral: Negative for hallucinations, self-injury, decreased concentration and agitation.      Objective:   Physical Exam BP 112/82  Pulse 78  Temp(Src) 97.3 F (36.3 C) (Oral)  Ht 6' (1.829 m)  Wt 185 lb 4 oz (  84.029 kg)  BMI 25.12 kg/m2  SpO2 95% Physical Exam  VS noted Constitutional: Pt is oriented to person, place, and time. Appears well-developed and well-nourished.  HENT:  Head: Normocephalic and atraumatic.  Right Ear: External ear normal.  Left Ear: External ear normal.  Nose: Nose normal.  Mouth/Throat: Oropharynx is clear and moist.  Eyes: Conjunctivae and EOM are normal. Pupils are equal, round, and reactive to light.  Neck: Normal range of motion. Neck supple. No JVD  present. No tracheal deviation present.  Cardiovascular: Normal rate, regular rhythm, normal heart sounds and intact distal pulses.   Pulmonary/Chest: Effort normal and breath sounds normal.  Abdominal: Soft. Bowel sounds are normal. There is no tenderness.  Musculoskeletal: Normal range of motion. Exhibits no edema.  Lymphadenopathy:  Has no cervical adenopathy.  Neurological: Pt is alert and oriented to person, place, and time. Pt has normal reflexes. No cranial nerve deficit.  Skin: Skin is warm and dry. No rash noted.  Psychiatric:  Has  normal mood and affect. Behavior is normal. 1+ nervous        Assessment & Plan:

## 2010-12-29 NOTE — Assessment & Plan Note (Signed)

## 2010-12-29 NOTE — Patient Instructions (Addendum)
Take all new medications as prescribed Continue all other medications as before Please return in 1 year for your yearly visit, or sooner if needed, with Lab testing done 3-5 days before`  

## 2011-01-07 ENCOUNTER — Other Ambulatory Visit: Payer: Self-pay | Admitting: Internal Medicine

## 2011-01-12 ENCOUNTER — Other Ambulatory Visit: Payer: Self-pay | Admitting: Internal Medicine

## 2011-06-18 ENCOUNTER — Other Ambulatory Visit: Payer: Self-pay | Admitting: Internal Medicine

## 2011-07-28 ENCOUNTER — Encounter: Payer: Self-pay | Admitting: Endocrinology

## 2011-07-28 ENCOUNTER — Ambulatory Visit (INDEPENDENT_AMBULATORY_CARE_PROVIDER_SITE_OTHER): Payer: Federal, State, Local not specified - PPO | Admitting: Endocrinology

## 2011-07-28 VITALS — BP 138/72 | HR 65 | Temp 96.9°F | Ht 71.0 in | Wt 182.8 lb

## 2011-07-28 DIAGNOSIS — J069 Acute upper respiratory infection, unspecified: Secondary | ICD-10-CM

## 2011-07-28 MED ORDER — CEFUROXIME AXETIL 250 MG PO TABS: 250.0000 mg | ORAL_TABLET | Freq: Two times a day (BID) | ORAL | Status: AC

## 2011-07-28 MED ORDER — ALPRAZOLAM 0.25 MG PO TABS: 0.2500 mg | ORAL_TABLET | Freq: Every day | ORAL | Status: DC | PRN

## 2011-07-28 NOTE — Patient Instructions (Addendum)
i have sent a prescription to your pharmacy, for an antibiotic.   You can continue to take the zyrtec and sudafed.   I hope you feel better soon.  If you don't feel better by next week, please call dr Jonny Ruiz.   Here is a refill of the xanax.

## 2011-07-28 NOTE — Progress Notes (Signed)
  Subjective:    Patient ID: Peter Leblanc, male    DOB: 1955-07-17, 56 y.o.   MRN: [id number]  HPI Pt states few weeks of moderate pain at both ears, and assoc nasal congestion.   He seldom takes the xanax, but it helps.   Past Medical History  Diagnosis Date  . ALLERGIC RHINITIS 02/15/2007  . CONJUNCTIVITIS, INFECTIOUS 09/04/2008  . ERECTILE DYSFUNCTION 09/26/2008  . HYPERLIPIDEMIA 11/08/2007  . KNEE PAIN, BILATERAL 11/29/2009  . PSA, INCREASED 11/08/2007  . SKIN LESION 11/29/2009  . Slowing of urinary stream 09/04/2008  . VULVAR ABSCESS 11/27/2009  . Genital herpes     Past Surgical History  Procedure Date  . Tonsillectomy   . Nasal sinus surgery 1997  . Vasectomy   . Ganglion cyst excision     History   Social History  . Marital Status: Married    Spouse Name: N/A    Number of Children: N/A  . Years of Education: N/A   Occupational History  . Not on file.   Social History Main Topics  . Smoking status: Never Smoker   . Smokeless tobacco: Not on file  . Alcohol Use: Yes  . Drug Use: No  . Sexually Active:    Other Topics Concern  . Not on file   Social History Narrative  . No narrative on file    Current Outpatient Prescriptions on File Prior to Visit  Medication Sig Dispense Refill  . acyclovir (ZOVIRAX) 200 MG capsule TAKE 1 CAPSULE BY MOUTH TWICE DAILY  180 capsule  0  . aspirin 81 MG EC tablet Take 81 mg by mouth daily.        . cetirizine (ZYRTEC) 10 MG tablet Take 1 tablet (10 mg total) by mouth daily.  90 tablet  3  . meloxicam (MOBIC) 15 MG tablet TAKE  ONE TABLET BY MOUTH NIGHTLY AT BEDTIME  30 tablet  11  . pravastatin (PRAVACHOL) 20 MG tablet TAKE ONE TABLET BY MOUTH ONE TIME DAILY  90 tablet  3  . Tamsulosin HCl (FLOMAX) 0.4 MG CAPS Take 1 capsule (0.4 mg total) by mouth daily.  90 capsule  3    No Known Allergies  Family History  Problem Relation Age of Onset  . Adopted: Yes    BP 138/72  Pulse 65  Temp(Src) 96.9 F (36.1 C) (Oral)  Ht 5'  11" (1.803 m)  Wt 182 lb 12 oz (82.895 kg)  BMI 25.49 kg/m2  SpO2 98%  Review of Systems Denies fever and cough    Objective:   Physical Exam VITAL SIGNS:  See vs page GENERAL: no distress head: no deformity eyes: no periorbital swelling, no proptosis external nose and ears are normal mouth: no lesion seen Both tm's are red      Assessment & Plan:  URI, new Anxiety, well-controlled

## 2011-08-19 ENCOUNTER — Ambulatory Visit (INDEPENDENT_AMBULATORY_CARE_PROVIDER_SITE_OTHER): Payer: Federal, State, Local not specified - PPO | Admitting: Internal Medicine

## 2011-08-19 ENCOUNTER — Encounter: Payer: Self-pay | Admitting: Internal Medicine

## 2011-08-19 VITALS — BP 118/88 | HR 80 | Temp 97.5°F | Ht 71.0 in | Wt 183.0 lb

## 2011-08-19 DIAGNOSIS — F411 Generalized anxiety disorder: Secondary | ICD-10-CM

## 2011-08-19 DIAGNOSIS — F419 Anxiety disorder, unspecified: Secondary | ICD-10-CM

## 2011-08-19 DIAGNOSIS — J309 Allergic rhinitis, unspecified: Secondary | ICD-10-CM

## 2011-08-19 DIAGNOSIS — J069 Acute upper respiratory infection, unspecified: Secondary | ICD-10-CM

## 2011-08-19 MED ORDER — PREDNISONE 10 MG PO TABS: 10.0000 mg | ORAL_TABLET | Freq: Every day | ORAL | Status: DC

## 2011-08-19 MED ORDER — ASPIRIN 81 MG PO TBEC: 81.0000 mg | DELAYED_RELEASE_TABLET | Freq: Two times a day (BID) | ORAL | Status: DC

## 2011-08-19 MED ORDER — AZITHROMYCIN 250 MG PO TABS
ORAL_TABLET | ORAL | Status: AC
Start: 2011-08-19 — End: 2011-08-24

## 2011-08-19 MED ORDER — CHLORPHENIRAMINE-PSEUDOEPH 12-120 MG PO TB12: 1.0000 | ORAL_TABLET | Freq: Two times a day (BID) | ORAL | Status: DC | PRN

## 2011-08-19 NOTE — Patient Instructions (Addendum)
Take all new medications as prescribed Continue all other medications as before OK to use the nasal saline as you suggested

## 2011-08-20 ENCOUNTER — Other Ambulatory Visit: Payer: Self-pay

## 2011-08-20 MED ORDER — CHLORPHENIRAMINE-PSEUDOEPH 12-120 MG PO TB12: 1.0000 | ORAL_TABLET | Freq: Two times a day (BID) | ORAL | Status: DC | PRN

## 2011-08-23 ENCOUNTER — Encounter: Payer: Self-pay | Admitting: Internal Medicine

## 2011-08-23 DIAGNOSIS — F419 Anxiety disorder, unspecified: Secondary | ICD-10-CM | POA: Insufficient documentation

## 2011-08-23 HISTORY — DX: Anxiety disorder, unspecified: F41.9

## 2011-08-23 NOTE — Assessment & Plan Note (Signed)
stable overall by hx and exam, most recent data reviewed with pt, and pt to continue medical treatment as before  Lab Results  Component Value Date   WBC 6.4 12/23/2010   HGB 14.5 12/23/2010   HCT 41.6 12/23/2010   PLT 193.0 12/23/2010   GLUCOSE 83 12/23/2010   CHOL 143 12/23/2010   TRIG 65.0 12/23/2010   HDL 55.20 12/23/2010   LDLCALC 75 12/23/2010   ALT 22 12/23/2010   AST 26 12/23/2010   NA 143 12/23/2010   K 4.5 12/23/2010   CL 109 12/23/2010   CREATININE 0.8 12/23/2010   BUN 19 12/23/2010   CO2 29 12/23/2010   TSH 1.10 12/23/2010   PSA 5.63* 12/23/2010

## 2011-08-23 NOTE — Assessment & Plan Note (Signed)
Mild uncontroleld, cont zyrtec asd, declines nasal steroid,  to f/u any worsening symptoms or concerns

## 2011-08-23 NOTE — Progress Notes (Signed)
Subjective:    Patient ID: Peter Leblanc, male    DOB: 1955/07/18, 57 y.o.   MRN: [id number]  HPI   Here with 3 days acute onset fever, facial pain, pressure, general weakness and malaise, and greenish d/c, with slight ST, but little to no cough and Pt denies chest pain, increased sob or doe, wheezing, orthopnea, PND, increased LE swelling, palpitations, dizziness or syncope. Pt denies new neurological symptoms such as new headache, or facial or extremity weakness or numbness   Pt denies polydipsia, polyuria.  Does have several wks ongoing nasal allergy symptoms with clear congestion, itch and sneeze, without fever, pain, ST, cough or wheezing.  Denies worsening depressive symptoms, suicidal ideation, or panic, though has ongoing anxiety, not increased recently.   Past Medical History  Diagnosis Date  . ALLERGIC RHINITIS 02/15/2007  . CONJUNCTIVITIS, INFECTIOUS 09/04/2008  . ERECTILE DYSFUNCTION 09/26/2008  . HYPERLIPIDEMIA 11/08/2007  . KNEE PAIN, BILATERAL 11/29/2009  . PSA, INCREASED 11/08/2007  . SKIN LESION 11/29/2009  . Slowing of urinary stream 09/04/2008  . VULVAR ABSCESS 11/27/2009  . Genital herpes    Past Surgical History  Procedure Date  . Tonsillectomy   . Nasal sinus surgery 1997  . Vasectomy   . Ganglion cyst excision     reports that he has never smoked. He does not have any smokeless tobacco history on file. He reports that he drinks alcohol. He reports that he does not use illicit drugs. family history is not on file.  He is adopted. No Known Allergies Current Outpatient Prescriptions on File Prior to Visit  Medication Sig Dispense Refill  . acyclovir (ZOVIRAX) 200 MG capsule TAKE 1 CAPSULE BY MOUTH TWICE DAILY  180 capsule  0  . cetirizine (ZYRTEC) 10 MG tablet Take 1 tablet (10 mg total) by mouth daily.  90 tablet  3  . meloxicam (MOBIC) 15 MG tablet TAKE  ONE TABLET BY MOUTH NIGHTLY AT BEDTIME  30 tablet  11  . pravastatin (PRAVACHOL) 20 MG tablet TAKE ONE TABLET BY MOUTH  ONE TIME DAILY  90 tablet  3  . Tamsulosin HCl (FLOMAX) 0.4 MG CAPS Take 1 capsule (0.4 mg total) by mouth daily.  90 capsule  3  . ALPRAZolam (XANAX) 0.25 MG tablet Take 1 tablet (0.25 mg total) by mouth daily as needed for anxiety.  30 tablet  0   Review of Systems Review of Systems  Constitutional: Negative for diaphoresis and unexpected weight change.  HENT: Negative for drooling and tinnitus.   Eyes: Negative for photophobia and visual disturbance.  Respiratory: Negative for choking and stridor.   Gastrointestinal: Negative for vomiting and blood in stool.  Genitourinary: Negative for hematuria and decreased urine volume.     Objective:   Physical Exam BP 118/88  Pulse 80  Temp(Src) 97.5 F (36.4 C) (Oral)  Ht 5\' 11"  (1.803 m)  Wt 183 lb (83.008 kg)  BMI 25.52 kg/m2  SpO2 93% Physical Exam  VS noted Constitutional: Pt appears well-developed and well-nourished.  HENT: Head: Normocephalic.  Right Ear: External ear normal.  Left Ear: External ear normal.  Bilat tm's mild erythema.  Sinus tender bilat.  Pharynx mild erythema Eyes: Conjunctivae and EOM are normal. Pupils are equal, round, and reactive to light.  Neck: Normal range of motion. Neck supple.  Cardiovascular: Normal rate and regular rhythm.   Pulmonary/Chest: Effort normal and breath sounds normal.  Neurological: Pt is alert. No cranial nerve deficit.  Skin: Skin is warm. No erythema.  Psychiatric: Pt behavior is normal. Thought content normal. Not depressed affect, 1+nervous     Assessment & Plan:

## 2011-08-23 NOTE — Assessment & Plan Note (Signed)
Mild to mod, for antibx course,  to f/u any worsening symptoms or concerns 

## 2011-10-01 ENCOUNTER — Other Ambulatory Visit: Payer: Self-pay

## 2011-10-01 MED ORDER — MELOXICAM 15 MG PO TABS: 15.0000 mg | ORAL_TABLET | Freq: Every day | ORAL | Status: DC

## 2011-11-26 ENCOUNTER — Telehealth: Payer: Self-pay

## 2011-11-26 DIAGNOSIS — Z0000 Encounter for general adult medical examination without abnormal findings: Secondary | ICD-10-CM

## 2011-11-26 NOTE — Telephone Encounter (Signed)
Put order in for physical labs. 

## 2011-11-30 ENCOUNTER — Other Ambulatory Visit: Payer: Self-pay | Admitting: Internal Medicine

## 2011-12-19 ENCOUNTER — Other Ambulatory Visit: Payer: Self-pay | Admitting: Internal Medicine

## 2011-12-28 ENCOUNTER — Other Ambulatory Visit: Payer: Self-pay | Admitting: Internal Medicine

## 2012-02-17 ENCOUNTER — Other Ambulatory Visit (INDEPENDENT_AMBULATORY_CARE_PROVIDER_SITE_OTHER): Payer: Federal, State, Local not specified - PPO

## 2012-02-17 DIAGNOSIS — Z0000 Encounter for general adult medical examination without abnormal findings: Secondary | ICD-10-CM

## 2012-02-17 LAB — BASIC METABOLIC PANEL
BUN: 14 mg/dL (ref 6–23)
CO2: 29 mEq/L (ref 19–32)
Chloride: 103 mEq/L (ref 96–112)
Creatinine, Ser: 0.9 mg/dL (ref 0.4–1.5)

## 2012-02-17 LAB — HEPATIC FUNCTION PANEL
ALT: 19 U/L (ref 0–53)
Total Protein: 6.5 g/dL (ref 6.0–8.3)

## 2012-02-17 LAB — URINALYSIS, ROUTINE W REFLEX MICROSCOPIC
Bilirubin Urine: NEGATIVE
Hgb urine dipstick: NEGATIVE
Leukocytes, UA: NEGATIVE
Nitrite: NEGATIVE
Total Protein, Urine: NEGATIVE

## 2012-02-17 LAB — LIPID PANEL
LDL Cholesterol: 59 mg/dL (ref 0–99)
Total CHOL/HDL Ratio: 3
Triglycerides: 183 mg/dL — ABNORMAL HIGH (ref 0.0–149.0)
VLDL: 36.6 mg/dL (ref 0.0–40.0)

## 2012-02-17 LAB — TSH: TSH: 1.38 u[IU]/mL (ref 0.35–5.50)

## 2012-02-17 LAB — CBC WITH DIFFERENTIAL/PLATELET
Basophils Relative: 0.5 % (ref 0.0–3.0)
Eosinophils Relative: 3.2 % (ref 0.0–5.0)
Hemoglobin: 14.7 g/dL (ref 13.0–17.0)
Lymphocytes Relative: 24.8 % (ref 12.0–46.0)
Monocytes Relative: 8.5 % (ref 3.0–12.0)
Neutro Abs: 5 10*3/uL (ref 1.4–7.7)
RBC: 4.46 Mil/uL (ref 4.22–5.81)

## 2012-02-18 ENCOUNTER — Encounter: Payer: Self-pay | Admitting: Internal Medicine

## 2012-02-18 ENCOUNTER — Ambulatory Visit (INDEPENDENT_AMBULATORY_CARE_PROVIDER_SITE_OTHER): Payer: Federal, State, Local not specified - PPO | Admitting: Internal Medicine

## 2012-02-18 VITALS — BP 122/90 | HR 82 | Temp 97.7°F | Ht 71.0 in | Wt 177.5 lb

## 2012-02-18 DIAGNOSIS — J309 Allergic rhinitis, unspecified: Secondary | ICD-10-CM

## 2012-02-18 DIAGNOSIS — Z0000 Encounter for general adult medical examination without abnormal findings: Secondary | ICD-10-CM

## 2012-02-18 DIAGNOSIS — R972 Elevated prostate specific antigen [PSA]: Secondary | ICD-10-CM

## 2012-02-18 MED ORDER — PRAVASTATIN SODIUM 20 MG PO TABS: 20.0000 mg | ORAL_TABLET | Freq: Every day | ORAL | Status: DC

## 2012-02-18 MED ORDER — TAMSULOSIN HCL 0.4 MG PO CAPS: 0.4000 mg | ORAL_CAPSULE | Freq: Every day | ORAL | Status: DC

## 2012-02-18 MED ORDER — CETIRIZINE HCL 10 MG PO TABS: 10.0000 mg | ORAL_TABLET | Freq: Every day | ORAL | Status: DC

## 2012-02-18 MED ORDER — TADALAFIL 20 MG PO TABS: 20.0000 mg | ORAL_TABLET | Freq: Every day | ORAL | Status: DC | PRN

## 2012-02-18 MED ORDER — ACYCLOVIR 200 MG PO CAPS: 200.0000 mg | ORAL_CAPSULE | Freq: Two times a day (BID) | ORAL | Status: DC

## 2012-02-18 NOTE — Progress Notes (Signed)
Subjective:    Patient ID: Peter Leblanc, male    DOB: 30-Mar-1956, 56 y.o.   MRN: [id number]  HPI  Here for wellness and f/u;  Overall doing ok;  Pt denies CP, worsening SOB, DOE, wheezing, orthopnea, PND, worsening LE edema, palpitations, dizziness or syncope.  Pt denies neurological change such as new Headache, facial or extremity weakness.  Pt denies polydipsia, polyuria, or low sugar symptoms. Pt states overall good compliance with treatment and medications, good tolerability, and trying to follow lower cholesterol diet.  Pt denies worsening depressive symptoms, suicidal ideation or panic. No fever, wt loss, night sweats, loss of appetite, or other constitutional symptoms.  Pt states good ability with ADL's, low fall risk, home safety reviewed and adequate, no significant changes in hearing or vision, and occasionally active with exercise.  Does have some decreased urinary flow with sudafed use in the past wk.  No other acute symptoms Past Medical History  Diagnosis Date  . ALLERGIC RHINITIS 02/15/2007  . CONJUNCTIVITIS, INFECTIOUS 09/04/2008  . ERECTILE DYSFUNCTION 09/26/2008  . HYPERLIPIDEMIA 11/08/2007  . KNEE PAIN, BILATERAL 11/29/2009  . PSA, INCREASED 11/08/2007  . SKIN LESION 11/29/2009  . Slowing of urinary stream 09/04/2008  . VULVAR ABSCESS 11/27/2009  . Genital herpes   . Anxiety 08/23/2011   Past Surgical History  Procedure Date  . Tonsillectomy   . Nasal sinus surgery 1997  . Vasectomy   . Ganglion cyst excision     reports that he has never smoked. He does not have any smokeless tobacco history on file. He reports that he drinks alcohol. He reports that he does not use illicit drugs. family history is not on file.  He is adopted. No Known Allergies Current Outpatient Prescriptions on File Prior to Visit  Medication Sig Dispense Refill  . aspirin 81 MG EC tablet Take 1 tablet (81 mg total) by mouth 2 (two) times daily.  60 tablet  11  . pravastatin (PRAVACHOL) 20 MG tablet Take 1  tablet (20 mg total) by mouth daily.  90 tablet  3  . tadalafil (CIALIS) 20 MG tablet Take 1 tablet (20 mg total) by mouth daily as needed.  10 tablet  11   Review of Systems Review of Systems  Constitutional: Negative for diaphoresis, activity change, appetite change and unexpected weight change.  HENT: Negative for hearing loss, ear pain, facial swelling, mouth sores and neck stiffness.   Eyes: Negative for pain, redness and visual disturbance.  Respiratory: Negative for shortness of breath and wheezing.   Cardiovascular: Negative for chest pain and palpitations.  Gastrointestinal: Negative for diarrhea, blood in stool, abdominal distention and rectal pain.  Genitourinary: Negative for hematuria, flank pain and decreased urine volume.  Musculoskeletal: Negative for myalgias and joint swelling.  Skin: Negative for color change and wound.  Neurological: Negative for syncope and numbness.  Hematological: Negative for adenopathy.  Psychiatric/Behavioral: Negative for hallucinations, self-injury, decreased concentration and agitation.      Objective:   Physical Exam BP 122/90  Pulse 82  Temp 97.7 F (36.5 C) (Oral)  Ht 5\' 11"  (1.803 m)  Wt 177 lb 8 oz (80.513 kg)  BMI 24.76 kg/m2  SpO2 96% Physical Exam  VS noted Constitutional: Pt is oriented to person, place, and time. Appears well-developed and well-nourished.  HENT:  Head: Normocephalic and atraumatic.  Right Ear: External ear normal.  Left Ear: External ear normal.  Nose: Nose normal.  Mouth/Throat: Oropharynx is clear and moist.  Eyes: Conjunctivae and EOM  are normal. Pupils are equal, round, and reactive to light.  Neck: Normal range of motion. Neck supple. No JVD present. No tracheal deviation present.  Cardiovascular: Normal rate, regular rhythm, normal heart sounds and intact distal pulses.   Pulmonary/Chest: Effort normal and breath sounds normal.  Abdominal: Soft. Bowel sounds are normal. There is no tenderness.    Musculoskeletal: Normal range of motion. Exhibits no edema.  Lymphadenopathy:  Has no cervical adenopathy.  Neurological: Pt is alert and oriented to person, place, and time. Pt has normal reflexes. No cranial nerve deficit.  Skin: Skin is warm and dry. No rash noted.  Psychiatric:  Has  normal mood and affect. Behavior is normal.     Assessment & Plan:

## 2012-02-18 NOTE — Patient Instructions (Addendum)
Please stop the sudafed containing OTC med Take all new medications as prescribed - the zyrtec Your cialis was refilled today You will be contacted regarding the referral for: Dr Isabel Caprice Please return in 1 year for your yearly visit, or sooner if needed, with Lab testing done 3-5 days before

## 2012-02-18 NOTE — Assessment & Plan Note (Addendum)
With further increase this yr, s/p biopsy x 1, for referral back to Dr Isabel Caprice Lab Results  Component Value Date   PSA 6.85* 02/17/2012   PSA 5.63* 12/23/2010   PSA 5.45* 11/27/2009

## 2012-02-18 NOTE — Assessment & Plan Note (Signed)

## 2012-02-18 NOTE — Assessment & Plan Note (Signed)
To change back to non sudafed med - such as zyrtec

## 2012-02-20 ENCOUNTER — Encounter: Payer: Self-pay | Admitting: Internal Medicine

## 2012-09-13 ENCOUNTER — Other Ambulatory Visit: Payer: Self-pay | Admitting: Internal Medicine

## 2012-10-01 ENCOUNTER — Other Ambulatory Visit: Payer: Self-pay | Admitting: Internal Medicine

## 2012-10-23 ENCOUNTER — Other Ambulatory Visit: Payer: Self-pay | Admitting: Internal Medicine

## 2012-11-04 ENCOUNTER — Other Ambulatory Visit: Payer: Self-pay | Admitting: *Deleted

## 2012-11-04 MED ORDER — MELOXICAM 15 MG PO TABS: 15.0000 mg | ORAL_TABLET | Freq: Every day | ORAL | Status: DC

## 2012-11-08 ENCOUNTER — Other Ambulatory Visit: Payer: Self-pay

## 2012-11-08 MED ORDER — MELOXICAM 15 MG PO TABS: 15.0000 mg | ORAL_TABLET | Freq: Every day | ORAL | Status: DC

## 2012-11-08 NOTE — Telephone Encounter (Signed)
Patient called requesting mobic be sent to [facility] in [place] as he is out of tw[place]

## 2012-11-11 ENCOUNTER — Ambulatory Visit: Payer: Federal, State, Local not specified - PPO

## 2012-11-11 DIAGNOSIS — Z0000 Encounter for general adult medical examination without abnormal findings: Secondary | ICD-10-CM

## 2012-11-11 LAB — CBC WITH DIFFERENTIAL/PLATELET
Basophils Relative: 1 % (ref 0–1)
Eosinophils Absolute: 0.1 10*3/uL (ref 0.0–0.7)
Eosinophils Relative: 2 % (ref 0–5)
Hemoglobin: 15 g/dL (ref 13.0–17.0)
Lymphs Abs: 2.1 10*3/uL (ref 0.7–4.0)
MCH: 32.8 pg (ref 26.0–34.0)
MCHC: 36.5 g/dL — ABNORMAL HIGH (ref 30.0–36.0)
MCV: 89.9 fL (ref 78.0–100.0)
Monocytes Relative: 11 % (ref 3–12)
Neutrophils Relative %: 52 % (ref 43–77)
Platelets: 248 10*3/uL (ref 150–400)

## 2012-11-11 LAB — BASIC METABOLIC PANEL
BUN: 11 mg/dL (ref 6–23)
CO2: 29 mEq/L (ref 19–32)
Calcium: 9.7 mg/dL (ref 8.4–10.5)
Chloride: 102 mEq/L (ref 96–112)
Creatinine, Ser: 0.9 mg/dL (ref 0.4–1.5)
GFR: 92.73 mL/min (ref >60.00)
Glucose, Bld: 100 mg/dL — ABNORMAL HIGH (ref 70–99)
Potassium: 4.5 mEq/L (ref 3.5–5.1)
Sodium: 138 mEq/L (ref 135–145)

## 2012-11-11 LAB — LIPID PANEL
Cholesterol: 180 mg/dL (ref 0–200)
HDL: 50 mg/dL (ref >39.00)
LDL Cholesterol: 117 mg/dL — ABNORMAL HIGH (ref 0–99)
Total CHOL/HDL Ratio: 4
Triglycerides: 64 mg/dL (ref 0.0–149.0)
VLDL: 12.8 mg/dL (ref 0.0–40.0)

## 2012-11-11 LAB — URINALYSIS, ROUTINE W REFLEX MICROSCOPIC
Ketones, ur: NEGATIVE
Leukocytes, UA: NEGATIVE
Nitrite: NEGATIVE
Specific Gravity, Urine: 1.01 (ref 1.000–1.030)
Urobilinogen, UA: 0.2 (ref 0.0–1.0)
pH: 7.5 (ref 5.0–8.0)

## 2012-11-11 LAB — HEPATIC FUNCTION PANEL
ALT: 15 U/L (ref 0–53)
AST: 19 U/L (ref 0–37)
Albumin: 4.3 g/dL (ref 3.5–5.2)
Alkaline Phosphatase: 38 U/L — ABNORMAL LOW (ref 39–117)
Bilirubin, Direct: 0.1 mg/dL (ref 0.0–0.3)
Total Bilirubin: 1 mg/dL (ref 0.3–1.2)
Total Protein: 6.9 g/dL (ref 6.0–8.3)

## 2012-11-15 LAB — PSA: PSA: 6.7 ng/mL — ABNORMAL HIGH (ref 0.10–4.00)

## 2012-11-15 LAB — TSH: TSH: 1.47 u[IU]/mL (ref 0.35–5.50)

## 2012-11-16 ENCOUNTER — Encounter: Payer: Self-pay | Admitting: Internal Medicine

## 2012-11-16 ENCOUNTER — Ambulatory Visit (INDEPENDENT_AMBULATORY_CARE_PROVIDER_SITE_OTHER): Payer: Federal, State, Local not specified - PPO | Admitting: Internal Medicine

## 2012-11-16 VITALS — BP 128/88 | HR 71 | Temp 98.1°F | Ht 71.0 in | Wt 178.0 lb

## 2012-11-16 DIAGNOSIS — J309 Allergic rhinitis, unspecified: Secondary | ICD-10-CM

## 2012-11-16 DIAGNOSIS — Z0000 Encounter for general adult medical examination without abnormal findings: Secondary | ICD-10-CM

## 2012-11-16 DIAGNOSIS — N401 Enlarged prostate with lower urinary tract symptoms: Secondary | ICD-10-CM | POA: Insufficient documentation

## 2012-11-16 DIAGNOSIS — N138 Other obstructive and reflux uropathy: Secondary | ICD-10-CM

## 2012-11-16 DIAGNOSIS — E785 Hyperlipidemia, unspecified: Secondary | ICD-10-CM

## 2012-11-16 DIAGNOSIS — R972 Elevated prostate specific antigen [PSA]: Secondary | ICD-10-CM

## 2012-11-16 DIAGNOSIS — N139 Obstructive and reflux uropathy, unspecified: Secondary | ICD-10-CM

## 2012-11-16 HISTORY — DX: Benign prostatic hyperplasia with lower urinary tract symptoms: N13.8

## 2012-11-16 MED ORDER — PRAVASTATIN SODIUM 20 MG PO TABS: 20.0000 mg | ORAL_TABLET | Freq: Every day | ORAL | Status: DC

## 2012-11-16 MED ORDER — CETIRIZINE HCL 10 MG PO TABS: 10.0000 mg | ORAL_TABLET | Freq: Every day | ORAL | Status: DC

## 2012-11-16 MED ORDER — TADALAFIL 2.5 MG PO TABS: ORAL_TABLET | ORAL | Status: DC

## 2012-11-16 MED ORDER — MELOXICAM 15 MG PO TABS: 15.0000 mg | ORAL_TABLET | Freq: Every day | ORAL | Status: DC

## 2012-11-16 MED ORDER — ACYCLOVIR 200 MG PO CAPS: 200.0000 mg | ORAL_CAPSULE | Freq: Two times a day (BID) | ORAL | Status: DC

## 2012-11-16 NOTE — Assessment & Plan Note (Signed)
Improved, now on cialis 2.5 daily and flomax, f/u urology

## 2012-11-16 NOTE — Assessment & Plan Note (Signed)

## 2012-11-16 NOTE — Patient Instructions (Signed)
OK to re-start the pravastatin Please continue all other medications as before, and all refills have been done today except for the cialis and flomax Please have the pharmacy call with any other refills you may need. Please consider OTC Zaditor and/or Nasacort for the eye allergies Please continue your efforts at being more active, low cholesterol diet, and weight control. You are otherwise up to date with prevention measures today. Please keep your appointments with your specialists as you have planned - urology in July  Please remember to sign up for My Chart if you have not done so, as this will be important to you in the future with finding out test results, communicating by private email, and scheduling acute appointments online when needed.  Please return in 1 year for your yearly visit, or sooner if needed, with Lab testing done 3-5 days before

## 2012-11-16 NOTE — Assessment & Plan Note (Signed)
To re-start pravastatin

## 2012-11-16 NOTE — Progress Notes (Signed)
Subjective:    Patient ID: Peter Leblanc, male    DOB: Apr 16, 1956, 57 y.o.   MRN: [id number]  HPI  Here for wellness and f/u;  Overall doing ok;  Pt denies CP, worsening SOB, DOE, wheezing, orthopnea, PND, worsening LE edema, palpitations, dizziness or syncope.  Pt denies neurological change such as new headache, facial or extremity weakness.  Pt denies polydipsia, polyuria, or low sugar symptoms. Pt states overall good compliance with treatment and medications, good tolerability, and has been trying to follow lower cholesterol diet.  Pt denies worsening depressive symptoms, suicidal ideation or panic. No fever, night sweats, wt loss, loss of appetite, or other constitutional symptoms.  Pt states good ability with ADL's, has low fall risk, home safety reviewed and adequate, no other significant changes in hearing or vision, and only occasionally active with exercise. Has f/u appt with Dr Grapey/urology in July. No acute complaints. Stopped taking the pravastatin, but willing to re-start. Has some itchy painless clear eye d/c for 1 mo Past Medical History  Diagnosis Date  . ALLERGIC RHINITIS 02/15/2007  . CONJUNCTIVITIS, INFECTIOUS 09/04/2008  . ERECTILE DYSFUNCTION 09/26/2008  . HYPERLIPIDEMIA 11/08/2007  . KNEE PAIN, BILATERAL 11/29/2009  . PSA, INCREASED 11/08/2007  . SKIN LESION 11/29/2009  . Slowing of urinary stream 09/04/2008  . VULVAR ABSCESS 11/27/2009  . Genital herpes   . Anxiety 08/23/2011   Past Surgical History  Procedure Laterality Date  . Tonsillectomy    . Nasal sinus surgery  1997  . Vasectomy    . Ganglion cyst excision      reports that he has never smoked. He does not have any smokeless tobacco history on file. He reports that  drinks alcohol. He reports that he does not use illicit drugs. family history is not on file. He is adopted. No Known Allergies Current Outpatient Prescriptions on File Prior to Visit  Medication Sig Dispense Refill  . acyclovir (ZOVIRAX) 200 MG capsule  Take 1 capsule (200 mg total) by mouth 2 (two) times daily.  180 capsule  3  . aspirin 81 MG EC tablet Take 1 tablet (81 mg total) by mouth 2 (two) times daily.  60 tablet  11  . cetirizine (ZYRTEC) 10 MG tablet Take 1 tablet (10 mg total) by mouth daily.  90 tablet  3  . meloxicam (MOBIC) 15 MG tablet Take 1 tablet (15 mg total) by mouth daily.  30 tablet  3  . tamsulosin (FLOMAX) 0.4 MG CAPS TAKE ONE CAPSULE BY MOUTH ONE TIME DAILY  90 capsule  1  . pravastatin (PRAVACHOL) 20 MG tablet Take 1 tablet (20 mg total) by mouth daily.  90 tablet  3   No current facility-administered medications on file prior to visit.    Review of Systems Constitutional: Negative for diaphoresis, activity change, appetite change or unexpected weight change.  HENT: Negative for hearing loss, ear pain, facial swelling, mouth sores and neck stiffness.   Eyes: Negative for pain, redness and visual disturbance.  Respiratory: Negative for shortness of breath and wheezing.   Cardiovascular: Negative for chest pain and palpitations.  Gastrointestinal: Negative for diarrhea, blood in stool, abdominal distention or other pain Genitourinary: Negative for hematuria, flank pain or change in urine volume.  Musculoskeletal: Negative for myalgias and joint swelling.  Skin: Negative for color change and wound.  Neurological: Negative for syncope and numbness. other than noted Hematological: Negative for adenopathy.  Psychiatric/Behavioral: Negative for hallucinations, self-injury, decreased concentration and agitation.  Objective:   Physical Exam BP 128/88  Pulse 71  Temp(Src) 98.1 F (36.7 C) (Oral)  Ht 5\' 11"  (1.803 m)  Wt 178 lb (80.74 kg)  BMI 24.84 kg/m2  SpO2 96% VS noted,  Constitutional: Pt is oriented to person, place, and time. Appears well-developed and well-nourished.  Head: Normocephalic and atraumatic.  Right Ear: External ear normal.  Left Ear: External ear normal.  Nose: Nose normal.   Mouth/Throat: Oropharynx is clear and moist.  Eyes: Conjunctivae and EOM are normal except for mild wateriness to conjucntiva. Pupils are equal, round, and reactive to light.  Neck: Normal range of motion. Neck supple. No JVD present. No tracheal deviation present.  Cardiovascular: Normal rate, regular rhythm, normal heart sounds and intact distal pulses.   Pulmonary/Chest: Effort normal and breath sounds normal.  Abdominal: Soft. Bowel sounds are normal. There is no tenderness. No HSM  Musculoskeletal: Normal range of motion. Exhibits no edema.  Lymphadenopathy:  Has no cervical adenopathy.  Neurological: Pt is alert and oriented to person, place, and time. Pt has normal reflexes. No cranial nerve deficit.  Skin: Skin is warm and dry. No rash noted.  Psychiatric:  Has  normal mood and affect. Behavior is normal.     Assessment & Plan:

## 2012-11-16 NOTE — Assessment & Plan Note (Signed)
Ok for [employer] and/or [employer]

## 2012-11-16 NOTE — Assessment & Plan Note (Signed)
To f/u urology as planned 

## 2013-10-17 ENCOUNTER — Other Ambulatory Visit: Payer: Self-pay | Admitting: Internal Medicine

## 2013-11-01 ENCOUNTER — Other Ambulatory Visit (HOSPITAL_COMMUNITY): Payer: Self-pay | Admitting: Urology

## 2013-11-01 DIAGNOSIS — R972 Elevated prostate specific antigen [PSA]: Secondary | ICD-10-CM

## 2013-11-15 ENCOUNTER — Ambulatory Visit (HOSPITAL_COMMUNITY)
Admission: RE | Admit: 2013-11-15 | Discharge: 2013-11-15 | Disposition: A | Discharge status: Home / Self Care | Payer: Federal, State, Local not specified - PPO | Source: Ambulatory Visit | Attending: Urology | Admitting: Urology

## 2013-11-15 DIAGNOSIS — N400 Enlarged prostate without lower urinary tract symptoms: Secondary | ICD-10-CM | POA: Insufficient documentation

## 2013-11-15 DIAGNOSIS — R972 Elevated prostate specific antigen [PSA]: Secondary | ICD-10-CM | POA: Insufficient documentation

## 2013-11-15 MED ORDER — GADOBENATE DIMEGLUMINE 529 MG/ML IV SOLN
20.0000 mL | Freq: Once | INTRAVENOUS | Status: AC | PRN
  Administered 2013-11-15: 17 mL via INTRAVENOUS

## 2013-12-08 ENCOUNTER — Other Ambulatory Visit: Payer: Self-pay | Admitting: Internal Medicine

## 2013-12-12 ENCOUNTER — Other Ambulatory Visit: Payer: Self-pay

## 2013-12-12 MED ORDER — CETIRIZINE HCL 10 MG PO TABS: 10.0000 mg | ORAL_TABLET | Freq: Every day | ORAL | Status: DC

## 2013-12-12 MED ORDER — PRAVASTATIN SODIUM 20 MG PO TABS: 20.0000 mg | ORAL_TABLET | Freq: Every day | ORAL | Status: DC

## 2013-12-12 MED ORDER — ACYCLOVIR 200 MG PO CAPS: 200.0000 mg | ORAL_CAPSULE | Freq: Two times a day (BID) | ORAL | Status: DC

## 2014-01-16 ENCOUNTER — Other Ambulatory Visit: Payer: Self-pay | Admitting: Internal Medicine

## 2014-02-13 ENCOUNTER — Encounter: Payer: Federal, State, Local not specified - PPO | Admitting: Internal Medicine

## 2014-02-14 ENCOUNTER — Encounter: Payer: Self-pay | Admitting: Internal Medicine

## 2014-02-14 ENCOUNTER — Ambulatory Visit (INDEPENDENT_AMBULATORY_CARE_PROVIDER_SITE_OTHER): Payer: Federal, State, Local not specified - PPO | Admitting: Internal Medicine

## 2014-02-14 VITALS — BP 134/80 | HR 84 | Temp 98.1°F | Ht 71.0 in | Wt 187.0 lb

## 2014-02-14 DIAGNOSIS — Z23 Encounter for immunization: Secondary | ICD-10-CM

## 2014-02-14 DIAGNOSIS — Z0000 Encounter for general adult medical examination without abnormal findings: Secondary | ICD-10-CM

## 2014-02-14 MED ORDER — CETIRIZINE HCL 10 MG PO TABS: 10.0000 mg | ORAL_TABLET | Freq: Every day | ORAL | Status: DC

## 2014-02-14 MED ORDER — ACYCLOVIR 200 MG PO CAPS: 200.0000 mg | ORAL_CAPSULE | Freq: Two times a day (BID) | ORAL | Status: DC

## 2014-02-14 MED ORDER — PRAVASTATIN SODIUM 20 MG PO TABS: 20.0000 mg | ORAL_TABLET | Freq: Every day | ORAL | Status: DC

## 2014-02-14 MED ORDER — MELOXICAM 15 MG PO TABS: 15.0000 mg | ORAL_TABLET | Freq: Every day | ORAL | Status: DC

## 2014-02-14 MED ORDER — TAMSULOSIN HCL 0.4 MG PO CAPS: 0.4000 mg | ORAL_CAPSULE | Freq: Every day | ORAL | Status: DC

## 2014-02-14 NOTE — Patient Instructions (Signed)
You had the flu shot today  Please continue all other medications as before, and refills have been done if requested.  Please have the pharmacy call with any other refills you may need.  Please continue your efforts at being more active, low cholesterol diet, and weight control.  You are otherwise up to date with prevention measures today.  Please keep your appointments with your specialists as you may have planned  Please go to the LAB in the Basement (turn left off the elevator) for the tests to be done today  You will be contacted by phone if any changes need to be made immediately.  Otherwise, you will receive a letter about your results with an explanation, but please check with MyChart first.  Please return in 1 year for your yearly visit, or sooner if needed, with Lab testing done 3-5 days before

## 2014-02-14 NOTE — Progress Notes (Signed)
Subjective:    Patient ID: Peter Leblanc, male    DOB: 24-Jan-1956, 58 y.o.   MRN: [id number]  HPI  Here for wellness and f/u;  Overall doing ok;  Pt denies CP, worsening SOB, DOE, wheezing, orthopnea, PND, worsening LE edema, palpitations, dizziness or syncope.  Pt denies neurological change such as new headache, facial or extremity weakness.  Pt denies polydipsia, polyuria, or low sugar symptoms. Pt states overall good compliance with treatment and medications, good tolerability, and has been trying to follow lower cholesterol diet.  Pt denies worsening depressive symptoms, suicidal ideation or panic. No fever, night sweats, wt loss, loss of appetite, or other constitutional symptoms.  Pt states good ability with ADL's, has low fall risk, home safety reviewed and adequate, no other significant changes in hearing or vision, and active with exercise.   Past Medical History  Diagnosis Date  . ALLERGIC RHINITIS 02/15/2007  . CONJUNCTIVITIS, INFECTIOUS 09/04/2008  . ERECTILE DYSFUNCTION 09/26/2008  . HYPERLIPIDEMIA 11/08/2007  . KNEE PAIN, BILATERAL 11/29/2009  . PSA, INCREASED 11/08/2007  . SKIN LESION 11/29/2009  . Slowing of urinary stream 09/04/2008  . VULVAR ABSCESS 11/27/2009  . Genital herpes   . Anxiety 08/23/2011  . 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 Tonsillectomy    . Nasal sinus surgery  1997  . Vasectomy    . Ganglion cyst excision      reports that he has never smoked. He does not have any smokeless tobacco history on file. He reports that he drinks alcohol. He reports that he does not use illicit drugs. family history is not on file. He was adopted. No Known Allergies Current Outpatient Prescriptions on File Prior to Visit  Medication Sig Dispense Refill  . aspirin 81 MG EC tablet Take 1 tablet (81 mg total) by mouth 2 (two) times daily.  60 tablet  11  . Tadalafil (CIALIS) 2.5 MG TABS 1 tab by  mouth per day  30 each  11   No current facility-administered medications on file prior to visit.   Review of Systems Constitutional: Negative for increased diaphoresis, other activity, appetite or other siginficant weight change  HENT: Negative for worsening hearing loss, ear pain, facial swelling, mouth sores and neck stiffness.   Eyes: Negative for other worsening pain, redness or visual disturbance.  Respiratory: Negative for shortness of breath and wheezing.   Cardiovascular: Negative for chest pain and palpitations.  Gastrointestinal: Negative for diarrhea, blood in stool, abdominal distention or other pain Genitourinary: Negative for hematuria, flank pain or change in urine volume.  Musculoskeletal: Negative for myalgias or other joint complaints.  Skin: Negative for color change and wound.  Neurological: Negative for syncope and numbness. other than noted Hematological: Negative for adenopathy. or other swelling Psychiatric/Behavioral: Negative for hallucinations, self-injury, decreased concentration or other worsening agitation.      Objective:   Physical Exam BP 134/80  Pulse 84  Temp(Src) 98.1 F (36.7 C) (Oral)  Ht 5\' 11"  (1.803 m)  Wt 187 lb (84.823 kg)  BMI 26.09 kg/m2  SpO2 93% VS noted,  Constitutional: Pt is oriented to person, place, and time. Appears well-developed and well-nourished.  Head: Normocephalic and atraumatic.  Right Ear: External ear normal.  Left Ear: External ear normal.  Nose: Nose normal.  Mouth/Throat: Oropharynx is clear and moist.  Eyes: Conjunctivae and EOM are normal. Pupils are equal, round, and reactive to light.  Neck: Normal  range of motion. Neck supple. No JVD present. No tracheal deviation present.  Cardiovascular: Normal rate, regular rhythm, normal heart sounds and intact distal pulses.   Pulmonary/Chest: Effort normal and breath sounds without rales or wheezing  Abdominal: Soft. Bowel sounds are normal. NT. No HSM    Musculoskeletal: Normal range of motion. Exhibits no edema.  Lymphadenopathy:  Has no cervical adenopathy.  Neurological: Pt is alert and oriented to person, place, and time. Pt has normal reflexes. No cranial nerve deficit. Motor grossly intact Skin: Skin is warm and dry. No rash noted.  Psychiatric:  Has normal mood and affect. Behavior is normal.     Assessment & Plan:

## 2014-02-14 NOTE — Progress Notes (Signed)
Pre visit review using our clinic review tool, if applicable. No additional management support is needed unless otherwise documented below in the visit note. 

## 2014-02-14 NOTE — Assessment & Plan Note (Signed)

## 2014-02-16 ENCOUNTER — Other Ambulatory Visit (INDEPENDENT_AMBULATORY_CARE_PROVIDER_SITE_OTHER): Payer: Federal, State, Local not specified - PPO

## 2014-02-16 ENCOUNTER — Encounter: Payer: Self-pay | Admitting: Internal Medicine

## 2014-02-16 DIAGNOSIS — Z0000 Encounter for general adult medical examination without abnormal findings: Secondary | ICD-10-CM

## 2014-02-16 LAB — URINALYSIS, ROUTINE W REFLEX MICROSCOPIC
Bilirubin Urine: NEGATIVE
Hgb urine dipstick: NEGATIVE
KETONES UR: NEGATIVE
Leukocytes, UA: NEGATIVE
Nitrite: NEGATIVE
PH: 6 (ref 5.0–8.0)
Specific Gravity, Urine: 1.01 (ref 1.000–1.030)
Total Protein, Urine: NEGATIVE
UROBILINOGEN UA: 0.2 (ref 0.0–1.0)
Urine Glucose: NEGATIVE

## 2014-02-16 LAB — CBC WITH DIFFERENTIAL/PLATELET
Basophils Absolute: 0 10*3/uL (ref 0.0–0.1)
Basophils Relative: 0.6 % (ref 0.0–3.0)
EOS PCT: 2.3 % (ref 0.0–5.0)
Eosinophils Absolute: 0.2 10*3/uL (ref 0.0–0.7)
HCT: 45.6 % (ref 39.0–52.0)
Hemoglobin: 15.8 g/dL (ref 13.0–17.0)
LYMPHS PCT: 28.1 % (ref 12.0–46.0)
Lymphs Abs: 1.9 10*3/uL (ref 0.7–4.0)
MCHC: 34.8 g/dL (ref 30.0–36.0)
MCV: 95.9 fl (ref 78.0–100.0)
MONO ABS: 0.6 10*3/uL (ref 0.1–1.0)
Monocytes Relative: 9.6 % (ref 3.0–12.0)
NEUTROS PCT: 59.4 % (ref 43.0–77.0)
Neutro Abs: 3.9 10*3/uL (ref 1.4–7.7)
Platelets: 209 10*3/uL (ref 150.0–400.0)
RBC: 4.75 Mil/uL (ref 4.22–5.81)
RDW: 12.8 % (ref 11.5–15.5)
WBC: 6.6 10*3/uL (ref 4.0–10.5)

## 2014-02-16 LAB — BASIC METABOLIC PANEL
BUN: 16 mg/dL (ref 6–23)
CHLORIDE: 104 meq/L (ref 96–112)
CO2: 27 meq/L (ref 19–32)
CREATININE: 1.1 mg/dL (ref 0.4–1.5)
Calcium: 9.4 mg/dL (ref 8.4–10.5)
GFR: 76.43 mL/min (ref >60.00)
Glucose, Bld: 91 mg/dL (ref 70–99)
POTASSIUM: 4.3 meq/L (ref 3.5–5.1)
Sodium: 139 mEq/L (ref 135–145)

## 2014-02-16 LAB — HEPATIC FUNCTION PANEL
ALT: 18 U/L (ref 0–53)
AST: 22 U/L (ref 0–37)
Albumin: 4.2 g/dL (ref 3.5–5.2)
Alkaline Phosphatase: 40 U/L (ref 39–117)
Bilirubin, Direct: 0.2 mg/dL (ref 0.0–0.3)
TOTAL PROTEIN: 7.2 g/dL (ref 6.0–8.3)
Total Bilirubin: 1.2 mg/dL (ref 0.2–1.2)

## 2014-02-16 LAB — LIPID PANEL
CHOL/HDL RATIO: 3
CHOLESTEROL: 160 mg/dL (ref 0–200)
HDL: 49.2 mg/dL (ref >39.00)
LDL Cholesterol: 97 mg/dL (ref 0–99)
NonHDL: 110.8
TRIGLYCERIDES: 67 mg/dL (ref 0.0–149.0)
VLDL: 13.4 mg/dL (ref 0.0–40.0)

## 2014-02-16 LAB — TSH: TSH: 1.64 u[IU]/mL (ref 0.35–4.50)

## 2014-10-15 ENCOUNTER — Telehealth: Payer: Self-pay | Admitting: Internal Medicine

## 2014-10-15 MED ORDER — PRAVASTATIN SODIUM 20 MG PO TABS: 20.0000 mg | ORAL_TABLET | Freq: Every day | ORAL | Status: DC

## 2014-10-15 MED ORDER — CETIRIZINE HCL 10 MG PO TABS: 10.0000 mg | ORAL_TABLET | Freq: Every day | ORAL | Status: DC

## 2014-10-15 MED ORDER — MELOXICAM 15 MG PO TABS: 15.0000 mg | ORAL_TABLET | Freq: Every day | ORAL | Status: DC

## 2014-10-15 MED ORDER — TAMSULOSIN HCL 0.4 MG PO CAPS
0.4000 mg | ORAL_CAPSULE | Freq: Every day | ORAL | Status: DC
Start: 2014-10-15 — End: 2015-04-11

## 2014-10-15 NOTE — Telephone Encounter (Signed)
Notified pt meds sent to pharmacy...Peter Leblanc
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
## 2015-04-11 ENCOUNTER — Other Ambulatory Visit: Payer: Self-pay | Admitting: Internal Medicine

## 2015-04-20 ENCOUNTER — Other Ambulatory Visit: Payer: Self-pay | Admitting: Internal Medicine

## 2015-05-08 ENCOUNTER — Ambulatory Visit (INDEPENDENT_AMBULATORY_CARE_PROVIDER_SITE_OTHER): Payer: Federal, State, Local not specified - PPO | Admitting: Internal Medicine

## 2015-05-08 ENCOUNTER — Encounter: Payer: Self-pay | Admitting: Internal Medicine

## 2015-05-08 ENCOUNTER — Other Ambulatory Visit (INDEPENDENT_AMBULATORY_CARE_PROVIDER_SITE_OTHER): Payer: Federal, State, Local not specified - PPO

## 2015-05-08 VITALS — BP 128/92 | HR 84 | Temp 97.9°F | Ht 71.0 in | Wt 185.0 lb

## 2015-05-08 DIAGNOSIS — Z0000 Encounter for general adult medical examination without abnormal findings: Secondary | ICD-10-CM | POA: Diagnosis not present

## 2015-05-08 LAB — CBC WITH DIFFERENTIAL/PLATELET
BASOS ABS: 0 10*3/uL (ref 0.0–0.1)
Basophils Relative: 0.6 % (ref 0.0–3.0)
EOS PCT: 2.3 % (ref 0.0–5.0)
Eosinophils Absolute: 0.2 10*3/uL (ref 0.0–0.7)
HEMATOCRIT: 44.1 % (ref 39.0–52.0)
HEMOGLOBIN: 14.9 g/dL (ref 13.0–17.0)
LYMPHS PCT: 32.5 % (ref 12.0–46.0)
Lymphs Abs: 2.2 10*3/uL (ref 0.7–4.0)
MCHC: 33.7 g/dL (ref 30.0–36.0)
MCV: 95.8 fl (ref 78.0–100.0)
MONOS PCT: 9 % (ref 3.0–12.0)
Monocytes Absolute: 0.6 10*3/uL (ref 0.1–1.0)
Neutro Abs: 3.8 10*3/uL (ref 1.4–7.7)
Neutrophils Relative %: 55.6 % (ref 43.0–77.0)
Platelets: 241 10*3/uL (ref 150.0–400.0)
RBC: 4.6 Mil/uL (ref 4.22–5.81)
RDW: 12.5 % (ref 11.5–15.5)
WBC: 6.9 10*3/uL (ref 4.0–10.5)

## 2015-05-08 LAB — HEPATIC FUNCTION PANEL
ALBUMIN: 4.4 g/dL (ref 3.5–5.2)
ALK PHOS: 39 U/L (ref 39–117)
ALT: 19 U/L (ref 0–53)
AST: 21 U/L (ref 0–37)
Bilirubin, Direct: 0.1 mg/dL (ref 0.0–0.3)
TOTAL PROTEIN: 7 g/dL (ref 6.0–8.3)
Total Bilirubin: 0.6 mg/dL (ref 0.2–1.2)

## 2015-05-08 LAB — BASIC METABOLIC PANEL
BUN: 15 mg/dL (ref 6–23)
CALCIUM: 9.6 mg/dL (ref 8.4–10.5)
CO2: 28 mEq/L (ref 19–32)
CREATININE: 0.84 mg/dL (ref 0.40–1.50)
Chloride: 104 mEq/L (ref 96–112)
GFR: 99.54 mL/min (ref >60.00)
GLUCOSE: 87 mg/dL (ref 70–99)
Potassium: 4 mEq/L (ref 3.5–5.1)
SODIUM: 139 meq/L (ref 135–145)

## 2015-05-08 LAB — URINALYSIS, ROUTINE W REFLEX MICROSCOPIC
BILIRUBIN URINE: NEGATIVE
HGB URINE DIPSTICK: NEGATIVE
Ketones, ur: NEGATIVE
LEUKOCYTES UA: NEGATIVE
NITRITE: NEGATIVE
RBC / HPF: NONE SEEN (ref 0–?)
Specific Gravity, Urine: 1.025 (ref 1.000–1.030)
TOTAL PROTEIN, URINE-UPE24: NEGATIVE
UROBILINOGEN UA: 0.2 (ref 0.0–1.0)
Urine Glucose: NEGATIVE
pH: 6.5 (ref 5.0–8.0)

## 2015-05-08 LAB — TSH: TSH: 1.6 u[IU]/mL (ref 0.35–4.50)

## 2015-05-08 LAB — LIPID PANEL
CHOLESTEROL: 161 mg/dL (ref 0–200)
HDL: 58.7 mg/dL (ref >39.00)
LDL CALC: 87 mg/dL (ref 0–99)
NonHDL: 102.41
Total CHOL/HDL Ratio: 3
Triglycerides: 75 mg/dL (ref 0.0–149.0)
VLDL: 15 mg/dL (ref 0.0–40.0)

## 2015-05-08 MED ORDER — CETIRIZINE HCL 10 MG PO TABS: 10.0000 mg | ORAL_TABLET | Freq: Every day | ORAL | Status: DC

## 2015-05-08 MED ORDER — TAMSULOSIN HCL 0.4 MG PO CAPS: ORAL_CAPSULE | ORAL | Status: DC

## 2015-05-08 MED ORDER — ACYCLOVIR 200 MG PO CAPS: 200.0000 mg | ORAL_CAPSULE | Freq: Two times a day (BID) | ORAL | Status: DC

## 2015-05-08 MED ORDER — PRAVASTATIN SODIUM 20 MG PO TABS: 20.0000 mg | ORAL_TABLET | Freq: Every day | ORAL | Status: DC

## 2015-05-08 MED ORDER — MELOXICAM 15 MG PO TABS: 15.0000 mg | ORAL_TABLET | Freq: Every day | ORAL | Status: DC

## 2015-05-08 NOTE — Progress Notes (Signed)
Subjective:    Patient ID: Peter Leblanc, male    DOB: 1956-02-04, 59 y.o.   MRN: [id number]  HPI  Here for wellness and f/u;  Overall doing ok;  Pt denies Chest pain, worsening SOB, DOE, wheezing, orthopnea, PND, worsening LE edema, palpitations, dizziness or syncope.  Pt denies neurological change such as new headache, facial or extremity weakness.  Pt denies polydipsia, polyuria, or low sugar symptoms. Pt states overall good compliance with treatment and medications, good tolerability, and has been trying to follow appropriate diet.  Pt denies worsening depressive symptoms, suicidal ideation or panic. No fever, night sweats, wt loss, loss of appetite, or other constitutional symptoms.  Pt states good ability with ADL's, has low fall risk, home safety reviewed and adequate, no other significant changes in hearing or vision, and only occasionally active with exercise.  Does walk 6 miles by his phone app daily.  Sees urology for elev psa,BPH with retention on flomax/cialis,  with f/u next month planned. Past Medical History  Diagnosis Date  . ALLERGIC RHINITIS 02/15/2007  . CONJUNCTIVITIS, INFECTIOUS 09/04/2008  . ERECTILE DYSFUNCTION 09/26/2008  . HYPERLIPIDEMIA 11/08/2007  . KNEE PAIN, BILATERAL 11/29/2009  . PSA, INCREASED 11/08/2007  . SKIN LESION 11/29/2009  . Slowing of urinary stream 09/04/2008  . VULVAR ABSCESS 11/27/2009  . Genital herpes   . Anxiety 08/23/2011  . 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 Tonsillectomy    . Nasal sinus surgery  1997  . Vasectomy    . Ganglion cyst excision      reports that he has never smoked. He does not have any smokeless tobacco history on file. He reports that he drinks alcohol. He reports that he does not use illicit drugs. family history is not on file. He was adopted. No Known Allergies Current Outpatient Prescriptions on File Prior to Visit  Medication Sig Dispense  Refill  . aspirin 81 MG EC tablet Take 1 tablet (81 mg total) by mouth 2 (two) times daily. 60 tablet 11  . Tadalafil (CIALIS) 2.5 MG TABS 1 tab by mouth per day (Patient not taking: Reported on 05/08/2015) 30 each 11   No current facility-administered medications on file prior to visit.   Review of Systems Constitutional: Negative for increased diaphoresis, other activity, appetite or siginficant weight change other than noted HENT: Negative for worsening hearing loss, ear pain, facial swelling, mouth sores and neck stiffness.   Eyes: Negative for other worsening pain, redness or visual disturbance.  Respiratory: Negative for shortness of breath and wheezing  Cardiovascular: Negative for chest pain and palpitations.  Gastrointestinal: Negative for diarrhea, blood in stool, abdominal distention or other pain Genitourinary: Negative for hematuria, flank pain or change in urine volume.  Musculoskeletal: Negative for myalgias or other joint complaints.  Skin: Negative for color change and wound or drainage.  Neurological: Negative for syncope and numbness. other than noted Hematological: Negative for adenopathy. or other swelling Psychiatric/Behavioral: Negative for hallucinations, SI, self-injury, decreased concentration or other worsening agitation.      Objective:   Physical Exam BP 128/92 mmHg  Pulse 84  Temp(Src) 97.9 F (36.6 C) (Oral)  Ht 5\' 11"  (1.803 m)  Wt 185 lb (83.915 kg)  BMI 25.81 kg/m2  SpO2 97% VS noted,  Constitutional: Pt is oriented to person, place, and time. Appears well-developed and well-nourished, in no significant distress Head: Normocephalic and atraumatic.  Right Ear: External ear normal.  Left Ear: External ear normal.  Nose: Nose normal.  Mouth/Throat: Oropharynx is clear and moist.  Eyes: Conjunctivae and EOM are normal. Pupils are equal, round, and reactive to light.  Neck: Normal range of motion. Neck supple. No JVD present. No tracheal deviation  present or significant neck LA or mass Cardiovascular: Normal rate, regular rhythm, normal heart sounds and intact distal pulses.   Pulmonary/Chest: Effort normal and breath sounds without rales or wheezing  Abdominal: Soft. Bowel sounds are normal. NT. No HSM  Musculoskeletal: Normal range of motion. Exhibits no edema.  Lymphadenopathy:  Has no cervical adenopathy.  Neurological: Pt is alert and oriented to person, place, and time. Pt has normal reflexes. No cranial nerve deficit. Motor grossly intact Skin: Skin is warm and dry. No rash noted.  Psychiatric:  Has normal mood and affect. Behavior is normal.     Assessment & Plan:

## 2015-05-08 NOTE — Progress Notes (Signed)
Pre visit review using our clinic review tool, if applicable. No additional management support is needed unless otherwise documented below in the visit note. 

## 2015-05-08 NOTE — Patient Instructions (Signed)

## 2015-05-08 NOTE — Assessment & Plan Note (Signed)

## 2015-05-09 ENCOUNTER — Other Ambulatory Visit: Payer: Self-pay | Admitting: Internal Medicine

## 2015-05-09 LAB — HEPATITIS C ANTIBODY: HCV Ab: NEGATIVE

## 2015-05-24 ENCOUNTER — Ambulatory Visit: Payer: Federal, State, Local not specified - PPO | Admitting: Family Medicine

## 2015-06-05 ENCOUNTER — Ambulatory Visit: Payer: Federal, State, Local not specified - PPO | Admitting: Family Medicine

## 2015-07-08 ENCOUNTER — Other Ambulatory Visit: Payer: Self-pay | Admitting: Internal Medicine

## 2015-12-06 ENCOUNTER — Encounter: Payer: Self-pay | Admitting: Internal Medicine

## 2015-12-06 ENCOUNTER — Ambulatory Visit (INDEPENDENT_AMBULATORY_CARE_PROVIDER_SITE_OTHER): Payer: Federal, State, Local not specified - PPO | Admitting: Internal Medicine

## 2015-12-06 VITALS — BP 140/86 | HR 87 | Temp 98.0°F | Resp 20 | Wt 189.0 lb

## 2015-12-06 DIAGNOSIS — I10 Essential (primary) hypertension: Secondary | ICD-10-CM | POA: Diagnosis not present

## 2015-12-06 DIAGNOSIS — G459 Transient cerebral ischemic attack, unspecified: Secondary | ICD-10-CM | POA: Diagnosis not present

## 2015-12-06 DIAGNOSIS — R1013 Epigastric pain: Secondary | ICD-10-CM

## 2015-12-06 DIAGNOSIS — R42 Dizziness and giddiness: Secondary | ICD-10-CM | POA: Diagnosis not present

## 2015-12-06 HISTORY — DX: Essential (primary) hypertension: I10

## 2015-12-06 HISTORY — DX: Transient cerebral ischemic attack, unspecified: G45.9

## 2015-12-06 MED ORDER — PANTOPRAZOLE SODIUM 40 MG PO TBEC: 40.0000 mg | DELAYED_RELEASE_TABLET | Freq: Every day | ORAL | Status: DC

## 2015-12-06 MED ORDER — LOSARTAN POTASSIUM 25 MG PO TABS: 25.0000 mg | ORAL_TABLET | Freq: Every day | ORAL | Status: DC

## 2015-12-06 NOTE — Assessment & Plan Note (Signed)
New onset with less active and wt gain, for start losartan 25 qd, cont to monitoro closely,  to f/u any worsening symptoms or concerns BP Readings from Last 3 Encounters:  12/06/15 140/86  05/08/15 128/92  02/14/14 134/80

## 2015-12-06 NOTE — Progress Notes (Signed)
Pre visit review using our clinic review tool, if applicable. No additional management support is needed unless otherwise documented below in the visit note. 

## 2015-12-06 NOTE — Patient Instructions (Addendum)
Please take all new medication as prescribed  - the losartan 25 mg (low dose, no side effects) for blood pressure, and Protonix (generic) for the dyspepsia  Please also start Aspirin 81 mg - 1 per day - EVERY day, (enteric coated only- OTC)  Please continue all other medications as before, and refills have been done if requested.  Please have the pharmacy call with any other refills you may need.  Please continue your efforts at being more active, low cholesterol diet, and weight control.  You are otherwise up to date with prevention measures today.  Please keep your appointments with your specialists as you may have planned  You will be contacted regarding the referral for: Neurology, Head MRI, carotid artery ultraosound, and Echocardiogram  Please make a Nurse appt at a different time for the EKG after our machines have been repaired  Please go to the LAB in the Basement (turn left off the elevator) for the tests to be done today  You will be contacted by phone if any changes need to be made immediately.  Otherwise, you will receive a letter about your results with an explanation, but please check with MyChart first.  Please remember to sign up for MyChart if you have not done so, as this will be important to you in the future with finding out test results, communicating by private email, and scheduling acute appointments online when needed.  Please return in 6 months, or sooner if needed

## 2015-12-06 NOTE — Progress Notes (Signed)
Subjective:    Patient ID: Peter Leblanc, male    DOB: 06-22-1956, 60 y.o.   MRN: [id number]  HPI  Here with episode 3 wks ago of transient confusion;  Was walking about 9am one day at work, when noticed right eye seemed to dilate and light became too much, then realized he was quite confused about where he was, who his coworkers were, and what he was doing.  Went to his office to sit, and coworkers followed him, did not seem medical attention as all symptoms gradually resolved after 90 minutes and have not recurred.  No HA, slurred speech, facial weakness and Pt denies new neurological symptoms such as new headache, or facial or extremity weakness or numbness.  Has significant ongoing emotional stressors, and no hx of TIA.  Quit taking asa several yrs ago. Also of note has been less active, gained about 10 lbs, not as strong upper body as he has been less active, and BP has been higher recently after 6-12 mo about this.   Has been taking the statin.     Also with c/o onset about the same time of mid and epigastric abd pain and recurring nausea, mostly constant, better and worse though not clear if food, position or anything else makes it worse or better.  No vomiting, and / Pt denies fever, wt loss, night sweats, loss of appetite, or other constitutional symptoms  Has been occas dizzy and weaker at times as well.  Pt denies chest pain, increased sob or doe, wheezing, orthopnea, PND, increased LE swelling, palpitations, dizziness or syncope. Did take some tums like med one night before bed and seemed to sleep well but not sure if o/w helped at all Past Medical History  Diagnosis Date  . ALLERGIC RHINITIS 02/15/2007  . CONJUNCTIVITIS, INFECTIOUS 09/04/2008  . ERECTILE DYSFUNCTION 09/26/2008  . HYPERLIPIDEMIA 11/08/2007  . KNEE PAIN, BILATERAL 11/29/2009  . PSA, INCREASED 11/08/2007  . SKIN LESION 11/29/2009  . Slowing of urinary stream 09/04/2008  . VULVAR ABSCESS 11/27/2009  . Genital herpes   . Anxiety  08/23/2011  . BPH (benign prostatic hypertrophy) with urinary obstruction 11/16/2012  . HTN (hypertension) 12/06/2015  . TIA (transient ischemic attack) 12/06/2015   Past Surgical History  Procedure Laterality Date  . Tonsillectomy    . Nasal sinus surgery  1997  . Vasectomy    . Ganglion cyst excision      reports that he has never smoked. He does not have any smokeless tobacco history on file. He reports that he drinks alcohol. He reports that he does not use illicit drugs. family history is not on file. He was adopted. No Known Allergies Current Outpatient Prescriptions on File Prior to Visit  Medication Sig Dispense Refill  . acyclovir (ZOVIRAX) 200 MG capsule Take 1 capsule (200 mg total) by mouth 2 (two) times daily. 180 capsule 3  . aspirin 81 MG EC tablet Take 1 tablet (81 mg total) by mouth 2 (two) times daily. 60 tablet 11  . cetirizine (ZYRTEC) 10 MG tablet Take 1 tablet (10 mg total) by mouth daily. 90 tablet 3  . meloxicam (MOBIC) 15 MG tablet Take 1 tablet (15 mg total) by mouth daily. 90 tablet 3  . pravastatin (PRAVACHOL) 20 MG tablet Take 1 tablet (20 mg total) by mouth daily. 90 tablet 3  . pravastatin (PRAVACHOL) 20 MG tablet TAKE 1 TABLET (20 MG TOTAL) BY MOUTH DAILY. 90 tablet 1  . Tadalafil (CIALIS) 2.5 MG TABS  1 tab by mouth per day 30 each 11  . tamsulosin (FLOMAX) 0.4 MG CAPS capsule TAKE 1 CAPSULE (0.4 MG TOTAL) BY MOUTH DAILY. 90 capsule 3   No current facility-administered medications on file prior to visit.   Review of Systems  Constitutional: Negative for unusual diaphoresis or night sweats HENT: Negative for ear swelling or discharge Eyes: Negative for worsening visual haziness  Respiratory: Negative for choking and stridor.   Gastrointestinal: Negative for distension or worsening eructation Genitourinary: Negative for retention or change in urine volume.  Musculoskeletal: Negative for other MSK pain or swelling Skin: Negative for color change and  worsening wound Neurological: Negative for tremors and numbness other than noted  Psychiatric/Behavioral: Negative for decreased concentration or agitation other than above       Objective:   Physical Exam BP 140/86 mmHg  Pulse 87  Temp(Src) 98 F (36.7 C) (Oral)  Resp 20  Wt 189 lb (85.73 kg)  SpO2 98% VS noted, non toxic Constitutional: Pt appears in no apparent distress HENT: Head: NCAT.  Right Ear: External ear normal.  Left Ear: External ear normal.  Eyes: . Pupils are equal, round, and reactive to light. Conjunctivae and EOM are normal Neck: Normal range of motion. Neck supple.  Cardiovascular: Normal rate and regular rhythm.   Pulmonary/Chest: Effort normal and breath sounds without rales or wheezing.  Abd:  Soft, NT, ND, + BS - benign exam, no guarding or rebound Neurological: Pt is alert. Not confused , motor grossly intact, cn 2-12 intact, alert and O x 3, sens/dtr intact and gait normal Skin: Skin is warm. No rash, no LE edema Psychiatric: Pt behavior is normal. No agitation.   Unable for ECG as machine is not working    Assessment & Plan:

## 2015-12-06 NOTE — Assessment & Plan Note (Addendum)
Etiology unclear, assoc with dizziness occas - suspect vagal effect, for PPI empiric, h pylori serology, lipase and labs as documented, consdier Gi referral if not improved

## 2015-12-06 NOTE — Assessment & Plan Note (Addendum)
asympt currently without recurrence episode 3 wks ago, to start asa 81 qd, cont statin, check lipids and a1c with labs, for BP control as below), also for Head MRI, carotids, echo, and consider cardiac montor, unable to do ecg today as machine is not working, asked to return for nurse to do this at a later time, also refer neurology  Note:  Total time for pt hx, exam, review of record with pt in the room, determination of diagnoses and plan for further eval and tx is > 40 min, with over 50% spent in coordination and counseling of patient

## 2015-12-11 ENCOUNTER — Ambulatory Visit (INDEPENDENT_AMBULATORY_CARE_PROVIDER_SITE_OTHER): Payer: Federal, State, Local not specified - PPO

## 2015-12-11 DIAGNOSIS — G459 Transient cerebral ischemic attack, unspecified: Secondary | ICD-10-CM | POA: Diagnosis not present

## 2015-12-15 ENCOUNTER — Encounter: Payer: Self-pay | Admitting: Internal Medicine

## 2015-12-15 ENCOUNTER — Ambulatory Visit
Admission: RE | Admit: 2015-12-15 | Discharge: 2015-12-15 | Disposition: A | Discharge status: Home / Self Care | Payer: Federal, State, Local not specified - PPO | Source: Ambulatory Visit | Attending: Internal Medicine | Admitting: Internal Medicine

## 2015-12-15 DIAGNOSIS — G459 Transient cerebral ischemic attack, unspecified: Secondary | ICD-10-CM

## 2015-12-17 ENCOUNTER — Ambulatory Visit (INDEPENDENT_AMBULATORY_CARE_PROVIDER_SITE_OTHER): Payer: Federal, State, Local not specified - PPO | Admitting: Neurology

## 2015-12-17 ENCOUNTER — Encounter: Payer: Self-pay | Admitting: Neurology

## 2015-12-17 VITALS — BP 124/76 | HR 86 | Ht 71.0 in | Wt 185.0 lb

## 2015-12-17 DIAGNOSIS — G43909 Migraine, unspecified, not intractable, without status migrainosus: Secondary | ICD-10-CM | POA: Diagnosis not present

## 2015-12-17 DIAGNOSIS — G43109 Migraine with aura, not intractable, without status migrainosus: Secondary | ICD-10-CM

## 2015-12-17 NOTE — Patient Instructions (Signed)
No further work up 

## 2015-12-17 NOTE — Progress Notes (Signed)
NEUROLOGY CONSULTATION NOTE  ZAKKAI ZAPPA MRN: [id number] DOB: 01/21/1956  Referring provider: Dr. Jenny Reichmann Primary care provider: Dr. Jenny Reichmann  Reason for consult:  Dizziness, TIA?  HISTORY OF PRESENT ILLNESS: Peter Leblanc is a 60 year old right-handed man with hyperlipidemia, hypertension, and BPH who presents for TIA.  History obtained by patient and PCP note.    In late May 2017, he was at work when he suddenly experienced a brightness or photosensitivity in his left eye, making it difficult for him to see.  He had it in the right eye as well, but not as severe.  He sat down in his office to rest.  During this period of time, people were coming in and out of his office to speak to him.  Apparently, he wasn't acting strange as nobody said anything to him.  He was unable to see them but was unable to recognize their voices.  This lasted for 30 to 45 minutes and resolved.  For the next 3 days, he had a frontal nonthrobbing headache.  He didn't quite feel well.  Since then, he has felt lethargic.  He didn't seek medical attention at the time.  He followed up with his PCP about 3 weeks later, and was started on ASA 81mg  daily.  MRI of brain from 12/15/15 was personally reviewed and revealed chronic small vessel ischemic changes but no acute or subacute infarct.  He denies history of headache, seizure or stroke.  He is adopted and his family history is unknown.    PAST MEDICAL HISTORY: Past Medical History  Diagnosis Date  . ALLERGIC RHINITIS 02/15/2007  . CONJUNCTIVITIS, INFECTIOUS 09/04/2008  . ERECTILE DYSFUNCTION 09/26/2008  . HYPERLIPIDEMIA 11/08/2007  . KNEE PAIN, BILATERAL 11/29/2009  . PSA, INCREASED 11/08/2007  . SKIN LESION 11/29/2009  . Slowing of urinary stream 09/04/2008  . VULVAR ABSCESS 11/27/2009  . Genital herpes   . Anxiety 08/23/2011  . BPH (benign prostatic hypertrophy) with urinary obstruction 11/16/2012  . HTN (hypertension) 12/06/2015  . TIA (transient ischemic attack) 12/06/2015      PAST SURGICAL HISTORY: Past Surgical History  Procedure Laterality Date  . Tonsillectomy    . Nasal sinus surgery  1997  . Vasectomy    . Ganglion cyst excision      MEDICATIONS: Current Outpatient Prescriptions on File Prior to Visit  Medication Sig Dispense Refill  . acyclovir (ZOVIRAX) 200 MG capsule Take 1 capsule (200 mg total) by mouth 2 (two) times daily. 180 capsule 3  . aspirin 81 MG EC tablet Take 1 tablet (81 mg total) by mouth 2 (two) times daily. 60 tablet 11  . losartan (COZAAR) 25 MG tablet Take 1 tablet (25 mg total) by mouth daily. 90 tablet 3  . meloxicam (MOBIC) 15 MG tablet Take 1 tablet (15 mg total) by mouth daily. 90 tablet 3  . pravastatin (PRAVACHOL) 20 MG tablet TAKE 1 TABLET (20 MG TOTAL) BY MOUTH DAILY. 90 tablet 1  . Tadalafil (CIALIS) 2.5 MG TABS 1 tab by mouth per day 30 each 11  . tamsulosin (FLOMAX) 0.4 MG CAPS capsule TAKE 1 CAPSULE (0.4 MG TOTAL) BY MOUTH DAILY. 90 capsule 3   No current facility-administered medications on file prior to visit.    ALLERGIES: No Known Allergies  FAMILY HISTORY: Family History  Problem Relation Age of Onset  . Adopted: Yes  . Family history unknown: Yes    SOCIAL HISTORY: Social History   Social History  . Marital Status: Married  Spouse Name: N/A  . Number of Children: N/A  . Years of Education: N/A   Occupational History  . Not on file.   Social History Main Topics  . Smoking status: Never Smoker   . Smokeless tobacco: Not on file  . Alcohol Use: Yes  . Drug Use: No  . Sexual Activity: Not on file   Other Topics Concern  . Not on file   Social History Narrative    REVIEW OF SYSTEMS: Constitutional: No fevers, chills, or sweats, no generalized fatigue, change in appetite Eyes: No visual changes, double vision, eye pain Ear, nose and throat: No hearing loss, ear pain, nasal congestion, sore throat Cardiovascular: No chest pain, palpitations Respiratory:  No shortness of breath at  rest or with exertion, wheezes GastrointestinaI: No nausea, vomiting, diarrhea, abdominal pain, fecal incontinence Genitourinary:  No dysuria, urinary retention or frequency Musculoskeletal:  No neck pain, back pain Integumentary: No rash, pruritus, skin lesions Neurological: as above Psychiatric: No depression, insomnia, anxiety Endocrine: No palpitations, fatigue, diaphoresis, mood swings, change in appetite, change in weight, increased thirst Hematologic/Lymphatic:  No purpura, petechiae. Allergic/Immunologic: no itchy/runny eyes, nasal congestion, recent allergic reactions, rashes  PHYSICAL EXAM: Filed Vitals:   12/17/15 1445  BP: 124/76  Pulse: 86   General: No acute distress.  Patient appears well-groomed.  Head:  Normocephalic/atraumatic Eyes:  fundi examined but not visualized Neck: supple, no paraspinal tenderness, full range of motion Back: No paraspinal tenderness Heart: regular rate and rhythm Lungs: Clear to auscultation bilaterally. Vascular: No carotid bruits. Neurological Exam: Mental status: alert and oriented to person, place, and time, recent and remote memory intact, fund of knowledge intact, attention and concentration intact, speech fluent and not dysarthric, language intact. Cranial nerves: CN I: not tested CN II: pupils equal, round and reactive to light, visual fields intact CN III, IV, VI:  full range of motion, no nystagmus, no ptosis CN V: facial sensation intact CN VII: upper and lower face symmetric CN VIII: hearing intact CN IX, X: gag intact, uvula midline CN XI: sternocleidomastoid and trapezius muscles intact CN XII: tongue midline Bulk & Tone: normal, no fasciculations. Motor:  5/5 throughout Sensation: temperature and vibration sensation intact. Deep Tendon Reflexes:  2+ throughout, toes downgoing.  Finger to nose testing:  Without dysmetria.  Heel to shin:  Without dysmetria.  Gait:  Normal station and stride.  Able to turn and tandem  walk. Romberg negative.  IMPRESSION: Probable complicated migraine.  MRI of brain is unremarkable.  I don't suspect TIA or seizure.  PLAN: No further workup or management. Follow up as needed.  Thank you for allowing me to take part in the care of this patient.  Metta Clines, DO  CC:  Cathlean Cower, MD

## 2015-12-18 ENCOUNTER — Other Ambulatory Visit: Payer: Self-pay

## 2015-12-18 ENCOUNTER — Ambulatory Visit (HOSPITAL_COMMUNITY): Payer: Federal, State, Local not specified - PPO | Attending: Internal Medicine

## 2015-12-18 DIAGNOSIS — G459 Transient cerebral ischemic attack, unspecified: Secondary | ICD-10-CM | POA: Insufficient documentation

## 2015-12-18 DIAGNOSIS — I340 Nonrheumatic mitral (valve) insufficiency: Secondary | ICD-10-CM | POA: Diagnosis not present

## 2015-12-18 DIAGNOSIS — I10 Essential (primary) hypertension: Secondary | ICD-10-CM | POA: Insufficient documentation

## 2015-12-19 ENCOUNTER — Encounter: Payer: Self-pay | Admitting: Internal Medicine

## 2015-12-19 LAB — ECHOCARDIOGRAM COMPLETE
AOASC: 35 cm
CHL CUP DOP CALC LVOT VTI: 24 cm
E decel time: 239 msec
E/e' ratio: 8.7
FS: 37 % (ref 28–44)
IV/PV OW: 1.01
LA ID, A-P, ES: 39 mm
LA diam end sys: 39 mm
LA vol index: 24.8 mL/m2
LADIAMINDEX: 1.89 cm/m2
LAVOL: 51 mL
LAVOLA4C: 49 mL
LV E/e'average: 8.7
LV e' LATERAL: 9.76 cm/s
LVEEMED: 8.7
LVOT SV: 68 mL
LVOT area: 2.84 cm2
LVOT peak grad rest: 6 mmHg
LVOT peak vel: 127 cm/s
LVOTD: 19 mm
MV Dec: 239
MV pk A vel: 52.4 m/s
MVPG: 3 mmHg
MVPKEVEL: 84.9 m/s
PW: 9.79 mm — AB (ref 0.6–1.1)
Reg peak vel: 201 cm/s
TDI e' lateral: 9.76
TDI e' medial: 9.65
TRMAXVEL: 201 cm/s

## 2016-01-20 ENCOUNTER — Other Ambulatory Visit: Payer: Self-pay | Admitting: Internal Medicine

## 2016-03-27 ENCOUNTER — Other Ambulatory Visit: Payer: Self-pay | Admitting: Internal Medicine

## 2016-03-27 ENCOUNTER — Ambulatory Visit (INDEPENDENT_AMBULATORY_CARE_PROVIDER_SITE_OTHER): Payer: Federal, State, Local not specified - PPO | Admitting: Internal Medicine

## 2016-03-27 ENCOUNTER — Ambulatory Visit (INDEPENDENT_AMBULATORY_CARE_PROVIDER_SITE_OTHER)
Admission: RE | Admit: 2016-03-27 | Discharge: 2016-03-27 | Disposition: A | Discharge status: Home / Self Care | Payer: Federal, State, Local not specified - PPO | Source: Ambulatory Visit | Attending: Internal Medicine | Admitting: Internal Medicine

## 2016-03-27 ENCOUNTER — Encounter: Payer: Self-pay | Admitting: Internal Medicine

## 2016-03-27 DIAGNOSIS — F419 Anxiety disorder, unspecified: Secondary | ICD-10-CM

## 2016-03-27 DIAGNOSIS — M546 Pain in thoracic spine: Secondary | ICD-10-CM

## 2016-03-27 DIAGNOSIS — I10 Essential (primary) hypertension: Secondary | ICD-10-CM | POA: Diagnosis not present

## 2016-03-27 MED ORDER — CYCLOBENZAPRINE HCL 5 MG PO TABS: 5.0000 mg | ORAL_TABLET | Freq: Three times a day (TID) | ORAL | 1 refills | Status: DC | PRN

## 2016-03-27 NOTE — Patient Instructions (Signed)
Please take all new medication as prescribed - the muscle relaxer  Please continue all other medications as before, including the alleve  Please have the pharmacy call with any other refills you may need.  Please continue your efforts at being more active, low cholesterol diet, and weight control.  Please keep your appointments with your specialists as you may have planned  Please go to the XRAY Department in the Basement (go straight as you get off the elevator) for the x-ray testing  You will be contacted by phone if any changes need to be made immediately.  Otherwise, you will receive a letter about your results with an explanation, but please check with MyChart first.  Please remember to sign up for MyChart if you have not done so, as this will be important to you in the future with finding out test results, communicating by private email, and scheduling acute appointments online when needed.

## 2016-03-27 NOTE — Assessment & Plan Note (Signed)
stable overall by history and exam, recent data reviewed with pt, and pt to continue medical treatment as before,  to f/u any worsening symptoms or concerns BP Readings from Last 3 Encounters:  03/27/16 138/80  12/17/15 124/76  12/06/15 140/86

## 2016-03-27 NOTE — Progress Notes (Signed)
Pre visit review using our clinic review tool, if applicable. No additional management support is needed unless otherwise documented below in the visit note. 

## 2016-03-27 NOTE — Assessment & Plan Note (Signed)
stable overall by history and exam, and pt to continue medical treatment as before,  to f/u any worsening symptoms or concerns 

## 2016-03-27 NOTE — Assessment & Plan Note (Signed)
Mild to mod, ? MSk vs other, for muscle relaxer prn, cont alleve prn, also for film r/o fx,  to f/u any worsening symptoms or concerns
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
## 2016-06-01 ENCOUNTER — Other Ambulatory Visit: Payer: Self-pay | Admitting: Internal Medicine

## 2016-06-03 ENCOUNTER — Ambulatory Visit: Payer: Federal, State, Local not specified - PPO | Admitting: Internal Medicine

## 2016-06-03 DIAGNOSIS — Z0289 Encounter for other administrative examinations: Secondary | ICD-10-CM

## 2016-07-19 ENCOUNTER — Other Ambulatory Visit: Payer: Self-pay | Admitting: Internal Medicine

## 2016-07-22 ENCOUNTER — Other Ambulatory Visit: Payer: Self-pay | Admitting: Internal Medicine

## 2016-08-03 ENCOUNTER — Other Ambulatory Visit: Payer: Self-pay | Admitting: Internal Medicine

## 2016-08-27 ENCOUNTER — Encounter: Payer: Self-pay | Admitting: Internal Medicine

## 2016-08-27 ENCOUNTER — Other Ambulatory Visit (INDEPENDENT_AMBULATORY_CARE_PROVIDER_SITE_OTHER): Payer: No Typology Code available for payment source

## 2016-08-27 ENCOUNTER — Ambulatory Visit (INDEPENDENT_AMBULATORY_CARE_PROVIDER_SITE_OTHER): Payer: No Typology Code available for payment source | Admitting: Internal Medicine

## 2016-08-27 VITALS — BP 162/110 | HR 86 | Temp 97.6°F | Wt 186.0 lb

## 2016-08-27 DIAGNOSIS — Z0001 Encounter for general adult medical examination with abnormal findings: Secondary | ICD-10-CM

## 2016-08-27 DIAGNOSIS — I10 Essential (primary) hypertension: Secondary | ICD-10-CM | POA: Diagnosis not present

## 2016-08-27 LAB — CBC WITH DIFFERENTIAL/PLATELET
BASOS PCT: 0.6 % (ref 0.0–3.0)
Basophils Absolute: 0 10*3/uL (ref 0.0–0.1)
EOS PCT: 2.9 % (ref 0.0–5.0)
Eosinophils Absolute: 0.2 10*3/uL (ref 0.0–0.7)
HCT: 45 % (ref 39.0–52.0)
Hemoglobin: 15.7 g/dL (ref 13.0–17.0)
LYMPHS ABS: 2.2 10*3/uL (ref 0.7–4.0)
Lymphocytes Relative: 28.6 % (ref 12.0–46.0)
MCHC: 34.9 g/dL (ref 30.0–36.0)
MCV: 95.5 fl (ref 78.0–100.0)
MONO ABS: 0.6 10*3/uL (ref 0.1–1.0)
MONOS PCT: 7.9 % (ref 3.0–12.0)
NEUTROS ABS: 4.5 10*3/uL (ref 1.4–7.7)
Neutrophils Relative %: 60 % (ref 43.0–77.0)
Platelets: 246 10*3/uL (ref 150.0–400.0)
RBC: 4.71 Mil/uL (ref 4.22–5.81)
RDW: 12.6 % (ref 11.5–15.5)
WBC: 7.5 10*3/uL (ref 4.0–10.5)

## 2016-08-27 LAB — URINALYSIS, ROUTINE W REFLEX MICROSCOPIC
Bilirubin Urine: NEGATIVE
HGB URINE DIPSTICK: NEGATIVE
Ketones, ur: NEGATIVE
LEUKOCYTES UA: NEGATIVE
Nitrite: NEGATIVE
SPECIFIC GRAVITY, URINE: 1.01 (ref 1.000–1.030)
TOTAL PROTEIN, URINE-UPE24: NEGATIVE
Urine Glucose: NEGATIVE
Urobilinogen, UA: 0.2 (ref 0.0–1.0)
pH: 6.5 (ref 5.0–8.0)

## 2016-08-27 LAB — HEPATIC FUNCTION PANEL
ALBUMIN: 4.6 g/dL (ref 3.5–5.2)
ALK PHOS: 45 U/L (ref 39–117)
ALT: 17 U/L (ref 0–53)
AST: 19 U/L (ref 0–37)
BILIRUBIN DIRECT: 0.1 mg/dL (ref 0.0–0.3)
BILIRUBIN TOTAL: 0.6 mg/dL (ref 0.2–1.2)
Total Protein: 6.9 g/dL (ref 6.0–8.3)

## 2016-08-27 LAB — BASIC METABOLIC PANEL
BUN: 13 mg/dL (ref 6–23)
CALCIUM: 9.8 mg/dL (ref 8.4–10.5)
CO2: 30 mEq/L (ref 19–32)
Chloride: 105 mEq/L (ref 96–112)
Creatinine, Ser: 0.87 mg/dL (ref 0.40–1.50)
GFR: 95.16 mL/min (ref >60.00)
Glucose, Bld: 90 mg/dL (ref 70–99)
POTASSIUM: 4.2 meq/L (ref 3.5–5.1)
SODIUM: 140 meq/L (ref 135–145)

## 2016-08-27 LAB — LIPID PANEL
CHOLESTEROL: 221 mg/dL — AB (ref 0–200)
HDL: 58.7 mg/dL (ref >39.00)
LDL CALC: 139 mg/dL — AB (ref 0–99)
NONHDL: 162.61
Total CHOL/HDL Ratio: 4
Triglycerides: 119 mg/dL (ref 0.0–149.0)
VLDL: 23.8 mg/dL (ref 0.0–40.0)

## 2016-08-27 LAB — PSA: PSA: 7.91 ng/mL — ABNORMAL HIGH (ref 0.10–4.00)

## 2016-08-27 LAB — TSH: TSH: 1.39 u[IU]/mL (ref 0.35–4.50)

## 2016-08-27 MED ORDER — PRAVASTATIN SODIUM 20 MG PO TABS: ORAL_TABLET | ORAL | 3 refills | Status: DC

## 2016-08-27 MED ORDER — TAMSULOSIN HCL 0.4 MG PO CAPS: ORAL_CAPSULE | ORAL | 3 refills | Status: DC

## 2016-08-27 MED ORDER — ACYCLOVIR 200 MG PO CAPS: 200.0000 mg | ORAL_CAPSULE | Freq: Two times a day (BID) | ORAL | 2 refills | Status: DC

## 2016-08-27 MED ORDER — VALSARTAN 160 MG PO TABS: 160.0000 mg | ORAL_TABLET | Freq: Every day | ORAL | 3 refills | Status: DC

## 2016-08-27 NOTE — Progress Notes (Signed)
Subjective:    Patient ID: Peter Leblanc, male    DOB: Mar 26, 1956, 61 y.o.   MRN: [id number]  HPI  Here for wellness and f/u;  Overall doing ok;  Pt denies Chest pain, worsening SOB, DOE, wheezing, orthopnea, PND, worsening LE edema, palpitations, dizziness or syncope.  Pt denies neurological change such as new headache, facial or extremity weakness.  Pt denies polydipsia, polyuria, or low sugar symptoms. Pt states overall good compliance with treatment and medications, good tolerability, and has been trying to follow appropriate diet.  Pt denies worsening depressive symptoms, suicidal ideation or panic. No fever, night sweats, wt loss, loss of appetite, or other constitutional symptoms.  Pt states good ability with ADL's, has low fall risk, home safety reviewed and adequate, no other significant changes in hearing or vision, and only occasionally active with exercise.  BP has been recently increased for now apparent reason it seems. Wt Readings from Last 3 Encounters:  08/27/16 186 lb (84.4 kg)  03/27/16 183 lb (83 kg)  12/17/15 185 lb (83.9 kg)   BP Readings from Last 3 Encounters:  08/27/16 (!) 162/110  03/27/16 138/80  12/17/15 124/76  New BP machine at [facility] wit iBP 150s consistently, was only 118 for his son.  Past Medical History:  Diagnosis Date  . ALLERGIC RHINITIS 02/15/2007  . Anxiety 08/23/2011  . BPH (benign prostatic hypertrophy) with urinary obstruction 11/16/2012  . CONJUNCTIVITIS, INFECTIOUS 09/04/2008  . ERECTILE DYSFUNCTION 09/26/2008  . Genital herpes   . HTN (hypertension) 12/06/2015  . HYPERLIPIDEMIA 11/08/2007  . KNEE PAIN, BILATERAL 11/29/2009  . PSA, INCREASED 11/08/2007  . SKIN LESION 11/29/2009  . Slowing of urinary stream 09/04/2008  . TIA (transient ischemic attack) 12/06/2015  . VULVAR ABSCESS 11/27/2009   Past Surgical History:  Procedure Laterality Date  . GANGLION CYST EXCISION    . NASAL SINUS SURGERY  1997  . TONSILLECTOMY    . VASECTOMY      reports that  he has never smoked. He has never used smokeless tobacco. He reports that he drinks alcohol. He reports that he does not use drugs. He was adopted. Family history is unknown by patient. No Known Allergies Current Outpatient Prescriptions on File Prior to Visit  Medication Sig Dispense Refill  . aspirin 81 MG EC tablet Take 1 tablet (81 mg total) by mouth 2 (two) times daily. 60 tablet 11  . meloxicam (MOBIC) 15 MG tablet Take 1 tablet (15 mg total) by mouth daily. 30 tablet 2   No current facility-administered medications on file prior to visit.    Review of Systems Constitutional: Negative for increased diaphoresis, or other activity, appetite or siginficant weight change other than noted HENT: Negative for worsening hearing loss, ear pain, facial swelling, mouth sores and neck stiffness.   Eyes: Negative for other worsening pain, redness or visual disturbance.  Respiratory: Negative for choking or stridor Cardiovascular: Negative for other chest pain and palpitations.  Gastrointestinal: Negative for worsening diarrhea, blood in stool, or abdominal distention Genitourinary: Negative for hematuria, flank pain or change in urine volume.  Musculoskeletal: Negative for myalgias or other joint complaints.  Skin: Negative for other color change and wound or drainage.  Neurological: Negative for syncope and numbness. other than noted Hematological: Negative for adenopathy. or other swelling Psychiatric/Behavioral: Negative for hallucinations, SI, self-injury, decreased concentration or other worsening agitation.  All other system neg per pt    Objective:   Physical Exam BP (!) 162/110   Pulse 86  Temp 97.6 F (36.4 C)   Wt 186 lb (84.4 kg)   SpO2 98%   BMI 25.94 kg/m  VS noted,  Constitutional: Pt is oriented to person, place, and time. Appears well-developed and well-nourished, in no significant distress Head: Normocephalic and atraumatic  Eyes: Conjunctivae and EOM are normal.  Pupils are equal, round, and reactive to light Right Ear: External ear normal.  Left Ear: External ear normal Nose: Nose normal.  Mouth/Throat: Oropharynx is clear and moist  Neck: Normal range of motion. Neck supple. No JVD present. No tracheal deviation present or significant neck LA or mass Cardiovascular: Normal rate, regular rhythm, normal heart sounds and intact distal pulses.   Pulmonary/Chest: Effort normal and breath sounds without rales or wheezing  Abdominal: Soft. Bowel sounds are normal. NT. No HSM  Musculoskeletal: Normal range of motion. Exhibits no edema Lymphadenopathy: Has no cervical adenopathy.  Neurological: Pt is alert and oriented to person, place, and time. Pt has normal reflexes. No cranial nerve deficit. Motor grossly intact Skin: Skin is warm and dry. No rash noted or new ulcers Psychiatric:  Has normal mood and affect. Behavior is normal.  No other exam findings    Assessment & Plan:

## 2016-08-27 NOTE — Patient Instructions (Addendum)
Please take all new medication as prescribed  - the generic diovan 160 mg per day  Please call in one week if your blood pressures are not mostly less than 140/90  You will be contacted regarding the referral for: Renal artery ultrasound  Please continue all other medications as before, and refills have been done if requested.  Please have the pharmacy call with any other refills you may need.  Please continue your efforts at being more active, low cholesterol diet, and weight control.  You are otherwise up to date with prevention measures today.  Please keep your appointments with your specialists as you may have planned  You will be contacted regarding the referral for: colonoscopy  Please go to the LAB in the Basement (turn left off the elevator) for the tests to be done today  You will be contacted by phone if any changes need to be made immediately.  Otherwise, you will receive a letter about your results with an explanation, but please check with MyChart first.  Please remember to sign up for MyChart if you have not done so, as this will be important to you in the future with finding out test results, communicating by private email, and scheduling acute appointments online when needed.  Please return in 6 months, or sooner if needed

## 2016-08-28 ENCOUNTER — Other Ambulatory Visit: Payer: Self-pay | Admitting: Internal Medicine

## 2016-08-28 ENCOUNTER — Encounter: Payer: Self-pay | Admitting: Internal Medicine

## 2016-08-28 MED ORDER — PRAVASTATIN SODIUM 40 MG PO TABS: 40.0000 mg | ORAL_TABLET | Freq: Every day | ORAL | 3 refills | Status: DC

## 2016-08-29 NOTE — Assessment & Plan Note (Signed)
New onset, for renal artery exam, start ARB,  to f/u any worsening symptoms or concerns

## 2016-08-29 NOTE — Assessment & Plan Note (Signed)

## 2016-09-15 ENCOUNTER — Other Ambulatory Visit: Payer: Self-pay | Admitting: Internal Medicine

## 2016-09-15 DIAGNOSIS — I10 Essential (primary) hypertension: Secondary | ICD-10-CM

## 2016-09-24 ENCOUNTER — Ambulatory Visit
Admission: RE | Admit: 2016-09-24 | Discharge: 2016-09-24 | Disposition: A | Discharge status: Home / Self Care | Payer: No Typology Code available for payment source | Source: Ambulatory Visit | Attending: Internal Medicine | Admitting: Internal Medicine

## 2016-09-24 ENCOUNTER — Other Ambulatory Visit: Payer: Self-pay | Admitting: Internal Medicine

## 2016-09-24 ENCOUNTER — Encounter: Payer: Self-pay | Admitting: Internal Medicine

## 2016-09-24 DIAGNOSIS — I10 Essential (primary) hypertension: Secondary | ICD-10-CM

## 2016-09-24 DIAGNOSIS — N2889 Other specified disorders of kidney and ureter: Secondary | ICD-10-CM

## 2016-09-25 ENCOUNTER — Telehealth: Payer: Self-pay

## 2016-09-25 NOTE — Telephone Encounter (Signed)
No need to order today, since it was already ordered with the original message. thanks

## 2016-09-25 NOTE — Telephone Encounter (Signed)
Results were given, patient would like to proceed with CT scan.

## 2016-09-25 NOTE — Telephone Encounter (Signed)
-----   Message from Biagio Borg, MD sent at 09/24/2016 12:54 PM EDT ----- Letter sent, cont same tx except  The test results show that your current treatment is OK as there was No finding of artery blockage.  There Was the suggestion, however, of a mass to the right kidney.  This is not definite, but possible, so we should look into this with a CT scan.  I will order this, and you should hear from the office as well.    Peter Leblanc to please inform pt, I will do order for CT

## 2016-10-06 ENCOUNTER — Encounter: Payer: Self-pay | Admitting: Gastroenterology

## 2016-10-21 ENCOUNTER — Other Ambulatory Visit: Payer: Self-pay | Admitting: Internal Medicine

## 2016-10-28 ENCOUNTER — Encounter: Payer: Self-pay | Admitting: Internal Medicine

## 2016-10-28 ENCOUNTER — Telehealth: Payer: Self-pay | Admitting: Internal Medicine

## 2016-10-28 MED ORDER — TAMSULOSIN HCL 0.4 MG PO CAPS: ORAL_CAPSULE | ORAL | 3 refills | Status: DC

## 2016-10-28 NOTE — Telephone Encounter (Signed)
Pt states his used to take 2 tablets for Flomax and would like a new Rx put in for 2x daily. States he used to take 2. I could not find that.     [facility] [address] [facility] [address]

## 2016-10-28 NOTE — Telephone Encounter (Signed)
Dr John please advise.  

## 2016-10-28 NOTE — Telephone Encounter (Signed)
Done erx 

## 2017-01-28 ENCOUNTER — Other Ambulatory Visit: Payer: Self-pay | Admitting: Internal Medicine

## 2017-06-11 ENCOUNTER — Other Ambulatory Visit: Payer: Self-pay | Admitting: Internal Medicine

## 2017-09-01 ENCOUNTER — Other Ambulatory Visit: Payer: Self-pay | Admitting: Internal Medicine

## 2017-09-02 ENCOUNTER — Other Ambulatory Visit: Payer: Self-pay | Admitting: Internal Medicine

## 2017-09-12 ENCOUNTER — Other Ambulatory Visit: Payer: Self-pay | Admitting: Internal Medicine

## 2017-11-04 ENCOUNTER — Other Ambulatory Visit: Payer: Self-pay | Admitting: Internal Medicine

## 2017-12-06 ENCOUNTER — Other Ambulatory Visit: Payer: Self-pay | Admitting: Internal Medicine

## 2017-12-07 ENCOUNTER — Other Ambulatory Visit: Payer: Self-pay | Admitting: Internal Medicine

## 2017-12-11 ENCOUNTER — Other Ambulatory Visit: Payer: Self-pay | Admitting: Internal Medicine

## 2017-12-21 ENCOUNTER — Encounter: Payer: Self-pay | Admitting: Internal Medicine

## 2017-12-21 ENCOUNTER — Other Ambulatory Visit (INDEPENDENT_AMBULATORY_CARE_PROVIDER_SITE_OTHER): Payer: No Typology Code available for payment source

## 2017-12-21 ENCOUNTER — Ambulatory Visit (INDEPENDENT_AMBULATORY_CARE_PROVIDER_SITE_OTHER): Payer: No Typology Code available for payment source | Admitting: Internal Medicine

## 2017-12-21 VITALS — BP 116/72 | HR 91 | Temp 98.4°F | Ht 71.0 in | Wt 178.0 lb

## 2017-12-21 DIAGNOSIS — I10 Essential (primary) hypertension: Secondary | ICD-10-CM

## 2017-12-21 DIAGNOSIS — Z0001 Encounter for general adult medical examination with abnormal findings: Secondary | ICD-10-CM | POA: Diagnosis not present

## 2017-12-21 DIAGNOSIS — F329 Major depressive disorder, single episode, unspecified: Secondary | ICD-10-CM

## 2017-12-21 DIAGNOSIS — F32A Depression, unspecified: Secondary | ICD-10-CM

## 2017-12-21 LAB — CBC WITH DIFFERENTIAL/PLATELET
BASOS ABS: 0 10*3/uL (ref 0.0–0.1)
Basophils Relative: 0.5 % (ref 0.0–3.0)
EOS ABS: 0.1 10*3/uL (ref 0.0–0.7)
Eosinophils Relative: 1.2 % (ref 0.0–5.0)
HEMATOCRIT: 44.5 % (ref 39.0–52.0)
HEMOGLOBIN: 15.4 g/dL (ref 13.0–17.0)
LYMPHS PCT: 28.3 % (ref 12.0–46.0)
Lymphs Abs: 2.1 10*3/uL (ref 0.7–4.0)
MCHC: 34.6 g/dL (ref 30.0–36.0)
MCV: 95.1 fl (ref 78.0–100.0)
Monocytes Absolute: 0.7 10*3/uL (ref 0.1–1.0)
Monocytes Relative: 9.7 % (ref 3.0–12.0)
Neutro Abs: 4.5 10*3/uL (ref 1.4–7.7)
Neutrophils Relative %: 60.3 % (ref 43.0–77.0)
PLATELETS: 246 10*3/uL (ref 150.0–400.0)
RBC: 4.68 Mil/uL (ref 4.22–5.81)
RDW: 12.7 % (ref 11.5–15.5)
WBC: 7.5 10*3/uL (ref 4.0–10.5)

## 2017-12-21 LAB — PSA: PSA: 9.18 ng/mL — ABNORMAL HIGH (ref 0.10–4.00)

## 2017-12-21 LAB — BASIC METABOLIC PANEL
BUN: 14 mg/dL (ref 6–23)
CHLORIDE: 104 meq/L (ref 96–112)
CO2: 31 mEq/L (ref 19–32)
Calcium: 9.7 mg/dL (ref 8.4–10.5)
Creatinine, Ser: 0.89 mg/dL (ref 0.40–1.50)
GFR: 92.29 mL/min (ref >60.00)
GLUCOSE: 92 mg/dL (ref 70–99)
Potassium: 5 mEq/L (ref 3.5–5.1)
Sodium: 140 mEq/L (ref 135–145)

## 2017-12-21 LAB — LIPID PANEL
CHOLESTEROL: 197 mg/dL (ref 0–200)
HDL: 61.1 mg/dL (ref >39.00)
LDL CALC: 112 mg/dL — AB (ref 0–99)
NonHDL: 136.24
Total CHOL/HDL Ratio: 3
Triglycerides: 122 mg/dL (ref 0.0–149.0)
VLDL: 24.4 mg/dL (ref 0.0–40.0)

## 2017-12-21 LAB — HEPATIC FUNCTION PANEL
ALK PHOS: 43 U/L (ref 39–117)
ALT: 11 U/L (ref 0–53)
AST: 17 U/L (ref 0–37)
Albumin: 4.4 g/dL (ref 3.5–5.2)
BILIRUBIN DIRECT: 0.1 mg/dL (ref 0.0–0.3)
TOTAL PROTEIN: 6.7 g/dL (ref 6.0–8.3)
Total Bilirubin: 0.7 mg/dL (ref 0.2–1.2)

## 2017-12-21 LAB — URINALYSIS, ROUTINE W REFLEX MICROSCOPIC
BILIRUBIN URINE: NEGATIVE
HGB URINE DIPSTICK: NEGATIVE
Ketones, ur: NEGATIVE
LEUKOCYTES UA: NEGATIVE
Nitrite: NEGATIVE
RBC / HPF: NONE SEEN (ref 0–?)
Specific Gravity, Urine: 1.025 (ref 1.000–1.030)
Total Protein, Urine: NEGATIVE
UROBILINOGEN UA: 0.2 (ref 0.0–1.0)
Urine Glucose: NEGATIVE
pH: 6 (ref 5.0–8.0)

## 2017-12-21 LAB — TSH: TSH: 1.36 u[IU]/mL (ref 0.35–4.50)

## 2017-12-21 MED ORDER — CITALOPRAM HYDROBROMIDE 20 MG PO TABS: 20.0000 mg | ORAL_TABLET | Freq: Every day | ORAL | 3 refills | Status: DC

## 2017-12-21 MED ORDER — PRAVASTATIN SODIUM 40 MG PO TABS: ORAL_TABLET | ORAL | 3 refills | Status: DC

## 2017-12-21 MED ORDER — TAMSULOSIN HCL 0.4 MG PO CAPS: ORAL_CAPSULE | ORAL | 3 refills | Status: DC

## 2017-12-21 MED ORDER — VALSARTAN 160 MG PO TABS: 160.0000 mg | ORAL_TABLET | Freq: Every day | ORAL | 3 refills | Status: DC

## 2017-12-21 MED ORDER — CIALIS 5 MG PO TABS: 5.0000 mg | ORAL_TABLET | Freq: Every day | ORAL | 6 refills | Status: DC

## 2017-12-21 MED ORDER — VALSARTAN 80 MG PO TABS: 80.0000 mg | ORAL_TABLET | Freq: Every day | ORAL | 3 refills | Status: DC

## 2017-12-21 NOTE — Assessment & Plan Note (Signed)
Here for wellness and f/u;  Overall doing ok;  Pt denies Chest pain, worsening SOB, DOE, wheezing, orthopnea, PND, worsening LE edema, palpitations, dizziness or syncope.  Pt denies neurological change such as new headache, facial or extremity weakness.  Pt denies polydipsia, polyuria, or low sugar symptoms. Pt states overall good compliance with treatment and medications, good tolerability, and has been trying to follow appropriate diet.  Pt denies worsening depressive symptoms, suicidal ideation or panic. No fever, night sweats, wt loss, loss of appetite, or other constitutional symptoms.  Pt states good ability with ADL's, has low fall risk, home safety reviewed and adequate, no other significant changes in hearing or vision, and only occasionally active with exercise.   

## 2017-12-21 NOTE — Patient Instructions (Addendum)
Please take all new medication as prescribed - the celexa 20 mg per day  OK to decrease the diovan to 80 mg per day  Please continue all other medications as before, and refills have been done if requested.  Please have the pharmacy call with any other refills you may need.  Please continue your efforts at being more active, low cholesterol diet, and weight control.  You are otherwise up to date with prevention measures today.  Please keep your appointments with your specialists as you may have planned  Please go to the LAB in the Basement (turn left off the elevator) for the tests to be done today  You will be contacted by phone if any changes need to be made immediately.  Otherwise, you will receive a letter about your results with an explanation, but please check with MyChart first.  Please remember to sign up for MyChart if you have not done so, as this will be important to you in the future with finding out test results, communicating by private email, and scheduling acute appointments online when needed.  Please return in 1 year for your yearly visit, or sooner if needed, with Lab testing done 3-5 days before

## 2017-12-21 NOTE — Assessment & Plan Note (Signed)
Overcontrolled, to decrease the diovan to 80 qd and cont to monitor at home, to f/u any worsening symptoms or concerns

## 2017-12-21 NOTE — Assessment & Plan Note (Signed)
Mild to mod, no SI or HI, for celexa 20 qd, declines referral counseling

## 2017-12-21 NOTE — Progress Notes (Signed)
Subjective:    Patient ID: Peter Leblanc, male    DOB: 09-Jan-1956, 62 y.o.   MRN: [id number]  HPI   Here for wellness and f/u;  Overall doing ok;  Pt denies Chest pain, worsening SOB, DOE, wheezing, orthopnea, PND, worsening LE edema, palpitations, dizziness or syncope.  Pt denies neurological change such as new headache, facial or extremity weakness.  Pt denies polydipsia, polyuria, or low sugar symptoms. Pt states overall good compliance with treatment and medications, good tolerability, and has been trying to follow appropriate diet.. No fever, night sweats, wt loss, loss of appetite, or other constitutional symptoms.  Pt states good ability with ADL's, has low fall risk, home safety reviewed and adequate, no other significant changes in hearing or vision, and only occasionally active with exercise.   Denies worsening depressive symptoms, suicidal ideation, or panic; has ongoing anxiety, not increased recently.  BP has been in 90s recently at home, felt woozy lightheaded.  Has been working outdoors with sweating, and lost wt.   Wt Readings from Last 3 Encounters:  12/21/17 178 lb (80.7 kg)  08/27/16 186 lb (84.4 kg)  03/27/16 183 lb (83 kg)   Past Medical History:  Diagnosis Date  . ALLERGIC RHINITIS 02/15/2007  . Anxiety 08/23/2011  . BPH (benign prostatic hypertrophy) with urinary obstruction 11/16/2012  . CONJUNCTIVITIS, INFECTIOUS 09/04/2008  . ERECTILE DYSFUNCTION 09/26/2008  . Genital herpes   . HTN (hypertension) 12/06/2015  . HYPERLIPIDEMIA 11/08/2007  . KNEE PAIN, BILATERAL 11/29/2009  . PSA, INCREASED 11/08/2007  . SKIN LESION 11/29/2009  . Slowing of urinary stream 09/04/2008  . TIA (transient ischemic attack) 12/06/2015  . VULVAR ABSCESS 11/27/2009   Past Surgical History:  Procedure Laterality Date  . GANGLION CYST EXCISION    . NASAL SINUS SURGERY  1997  . TONSILLECTOMY    . VASECTOMY      reports that he has never smoked. He has never used smokeless tobacco. He reports that he  drinks alcohol. He reports that he does not use drugs. He was adopted. Family history is unknown by patient. No Known Allergies Current Outpatient Medications on File Prior to Visit  Medication Sig Dispense Refill  . aspirin EC 81 MG tablet Take 81 mg by mouth daily.     No current facility-administered medications on file prior to visit.    Review of Systems Constitutional: Negative for other unusual diaphoresis, sweats, appetite or weight changes HENT: Negative for other worsening hearing loss, ear pain, facial swelling, mouth sores or neck stiffness.   Eyes: Negative for other worsening pain, redness or other visual disturbance.  Respiratory: Negative for other stridor or swelling Cardiovascular: Negative for other palpitations or other chest pain  Gastrointestinal: Negative for worsening diarrhea or loose stools, blood in stool, distention or other pain Genitourinary: Negative for hematuria, flank pain or other change in urine volume.  Musculoskeletal: Negative for myalgias or other joint swelling.  Skin: Negative for other color change, or other wound or worsening drainage.  Neurological: Negative for other syncope or numbness. Hematological: Negative for other adenopathy or swelling Psychiatric/Behavioral: Negative for hallucinations, other worsening agitation, SI, self-injury, or new decreased concentration All other system neg per    Objective:   Physical Exam BP 116/72   Pulse 91   Temp 98.4 F (36.9 C) (Oral)   Ht 5\' 11"  (1.803 m)   Wt 178 lb (80.7 kg)   SpO2 95%   BMI 24.83 kg/m  VS noted,  Constitutional: Pt is oriented  to person, place, and time. Appears well-developed and well-nourished, in no significant distress and comfortable Head: Normocephalic and atraumatic  Eyes: Conjunctivae and EOM are normal. Pupils are equal, round, and reactive to light Right Ear: External ear normal without discharge Left Ear: External ear normal without discharge Nose: Nose  without discharge or deformity Mouth/Throat: Oropharynx is without other ulcerations and moist  Neck: Normal range of motion. Neck supple. No JVD present. No tracheal deviation present or significant neck LA or mass Cardiovascular: Normal rate, regular rhythm, normal heart sounds and intact distal pulses.   Pulmonary/Chest: WOB normal and breath sounds without rales or wheezing  Abdominal: Soft. Bowel sounds are normal. NT. No HSM  Musculoskeletal: Normal range of motion. Exhibits no edema Lymphadenopathy: Has no other cervical adenopathy.  Neurological: Pt is alert and oriented to person, place, and time. Pt has normal reflexes. No cranial nerve deficit. Motor grossly intact, Gait intact Skin: Skin is warm and dry. No rash noted or new ulcerations Psychiatric:  Has depressed nervous mood and affect. Behavior is normal without agitation No other exam findings Lab Results  Component Value Date   WBC 7.5 08/27/2016   HGB 15.7 08/27/2016   HCT 45.0 08/27/2016   PLT 246.0 08/27/2016   GLUCOSE 90 08/27/2016   CHOL 221 (H) 08/27/2016   TRIG 119.0 08/27/2016   HDL 58.70 08/27/2016   LDLCALC 139 (H) 08/27/2016   ALT 17 08/27/2016   AST 19 08/27/2016   NA 140 08/27/2016   K 4.2 08/27/2016   CL 105 08/27/2016   CREATININE 0.87 08/27/2016   BUN 13 08/27/2016   CO2 30 08/27/2016   TSH 1.39 08/27/2016   PSA 7.91 (H) 08/27/2016       Assessment & Plan:

## 2017-12-27 ENCOUNTER — Other Ambulatory Visit: Payer: Self-pay

## 2017-12-27 MED ORDER — VALSARTAN 80 MG PO TABS: 80.0000 mg | ORAL_TABLET | Freq: Every day | ORAL | 3 refills | Status: DC

## 2017-12-27 NOTE — Telephone Encounter (Signed)
Pharmacy wanted script resent because they accidentally deleted it out of their system.

## 2018-06-18 ENCOUNTER — Other Ambulatory Visit: Payer: Self-pay | Admitting: Internal Medicine

## 2018-07-06 ENCOUNTER — Ambulatory Visit: Payer: Self-pay | Admitting: *Deleted

## 2018-07-06 NOTE — Telephone Encounter (Signed)
Pt questioning if losartan 25mg  was substituted for Valsartan 80mg . States he went to pick up Valsartan and was made aware RX was for Losartin 25mg , which is on his current med profile. Noted Valsartan D/Ced 06/18/18.  Please clarify CB# [phone]  Reason for Disposition . Caller has NON-URGENT medication question about med that PCP prescribed and triager unable to answer question  Answer Assessment - Initial Assessment Questions 1. SYMPTOMS: "Do you have any symptoms?"     no 2. SEVERITY: If symptoms are present, ask "Are they mild, moderate or severe?"     N/A  Protocols used: MEDICATION QUESTION CALL-A-AH

## 2018-07-07 NOTE — Telephone Encounter (Signed)
Please advise 

## 2018-07-07 NOTE — Telephone Encounter (Signed)
Returned pt's call, VM is full and can not receive any other messages at this time.

## 2018-07-07 NOTE — Telephone Encounter (Signed)
That is likely true, as valsartan is no longer available

## 2018-07-18 ENCOUNTER — Telehealth: Payer: Self-pay | Admitting: Internal Medicine

## 2018-07-18 MED ORDER — LOSARTAN POTASSIUM 25 MG PO TABS: ORAL_TABLET | ORAL | 1 refills | Status: DC

## 2018-07-18 NOTE — Telephone Encounter (Signed)
Copied from [place] [id number]. Topic: Quick Communication - Rx Refill/Question >> Jul 18, 2018 10:17 AM Sheran Luz wrote: Medication: losartan (COZAAR) 25 MG tablet    Marjory Lies, with [facility], calling requesting clarification on this medication. Specifically the directions for this medication. Please advise.   Cb# [phone]

## 2018-08-18 ENCOUNTER — Telehealth: Payer: Self-pay

## 2018-08-18 MED ORDER — MEFLOQUINE HCL 250 MG PO TABS: 250.0000 mg | ORAL_TABLET | ORAL | 0 refills | Status: DC

## 2018-08-18 NOTE — Telephone Encounter (Signed)
Pt stated that he would be there for 3 months.

## 2018-08-18 NOTE — Telephone Encounter (Signed)
Done erx 

## 2018-08-18 NOTE — Telephone Encounter (Signed)
Copied from [place] [id number]. Topic: General - Inquiry >> Aug 17, 2018  3:56 PM Rutherford Nail, NT wrote: Reason for CRM: Patient's daughter calling and states that the patient had called earlier this morning and was requesting the medication "chloroquine" to treat milaria. States that the patient and herself are going to [place] in 1-2 months with their church and he is needing this medication sent to the pharmacy. Please advise. [facility] [address] [place]

## 2018-08-18 NOTE — Addendum Note (Signed)
Addended by: Biagio Borg on: 08/18/2018 05:15 PM   Modules accepted: Orders

## 2018-08-18 NOTE — Telephone Encounter (Signed)
We normally tx with weekly mefloquine, which is similar  Need to know how many weeks he will be there

## 2018-09-09 ENCOUNTER — Other Ambulatory Visit: Payer: Self-pay | Admitting: Internal Medicine

## 2018-12-11 ENCOUNTER — Other Ambulatory Visit: Payer: Self-pay | Admitting: Internal Medicine

## 2018-12-26 ENCOUNTER — Telehealth: Payer: Self-pay

## 2018-12-26 DIAGNOSIS — Z20822 Contact with and (suspected) exposure to covid-19: Secondary | ICD-10-CM

## 2018-12-26 NOTE — Addendum Note (Signed)
Addended by: Denyce Robert on: 12/26/2018 03:29 PM   Modules accepted: Orders

## 2018-12-26 NOTE — Telephone Encounter (Signed)
-----   Message from Biagio Borg, MD sent at 12/26/2018  2:01 PM EDT ----- Yes, please refer for covid testing, thanks ----- Message ----- From: Juliet Rude, CMA Sent: 12/26/2018   1:52 PM EDT To: Biagio Borg, MD  Called pt to screen for covid prior to OV tomorrow for CPE. He stated that he has been having cough an loss of taste or smell within the last week. He stated that he would like to cancel his appt and be scheduled for covid19 testing. Ok to route for testing.

## 2018-12-26 NOTE — Telephone Encounter (Signed)
Left patient a voicemail to call back to schedule COVID 19 test.  Test ordered.

## 2018-12-26 NOTE — Telephone Encounter (Signed)
Please contact pt for covid testing. Thanks!

## 2018-12-27 ENCOUNTER — Encounter: Payer: No Typology Code available for payment source | Admitting: Internal Medicine

## 2018-12-27 NOTE — Telephone Encounter (Signed)
Pt. Called back and scheduled for tomorrow. 

## 2018-12-28 ENCOUNTER — Other Ambulatory Visit: Payer: No Typology Code available for payment source

## 2018-12-28 DIAGNOSIS — Z20822 Contact with and (suspected) exposure to covid-19: Secondary | ICD-10-CM

## 2019-01-03 LAB — NOVEL CORONAVIRUS, NAA: SARS-CoV-2, NAA: NOT DETECTED

## 2019-01-06 ENCOUNTER — Telehealth: Payer: Self-pay | Admitting: Internal Medicine

## 2019-01-06 NOTE — Telephone Encounter (Signed)
Copied from [place] [id number]. Topic: Appointment Scheduling - Scheduling Inquiry for Clinic >> Jan 06, 2019 11:19 AM Rayann Heman wrote: Reason for CRM: pt wife called and stated that she would like to reschedule pt CPE. Pt tested negative for covid.

## 2019-01-06 NOTE — Telephone Encounter (Signed)
[place] for [place] at his convenience

## 2019-01-06 NOTE — Telephone Encounter (Signed)
Called patient. He is still experiencing a constant headache, diarrhea, and fatigue. Okay to schedule for a CPE in office or wait until symptoms are gone?  *He did result negative for COVID on 07/14/20208*  Please advise.

## 2019-01-09 NOTE — Telephone Encounter (Signed)
Appointment has been made for 8/6.

## 2019-01-10 ENCOUNTER — Other Ambulatory Visit: Payer: Self-pay | Admitting: Internal Medicine

## 2019-01-25 ENCOUNTER — Ambulatory Visit (INDEPENDENT_AMBULATORY_CARE_PROVIDER_SITE_OTHER): Payer: No Typology Code available for payment source | Admitting: Internal Medicine

## 2019-01-25 ENCOUNTER — Encounter: Payer: Self-pay | Admitting: Internal Medicine

## 2019-01-25 DIAGNOSIS — F419 Anxiety disorder, unspecified: Secondary | ICD-10-CM

## 2019-01-25 DIAGNOSIS — J22 Unspecified acute lower respiratory infection: Secondary | ICD-10-CM | POA: Diagnosis not present

## 2019-01-25 DIAGNOSIS — J309 Allergic rhinitis, unspecified: Secondary | ICD-10-CM

## 2019-01-25 DIAGNOSIS — Z20822 Contact with and (suspected) exposure to covid-19: Secondary | ICD-10-CM

## 2019-01-25 DIAGNOSIS — Z20828 Contact with and (suspected) exposure to other viral communicable diseases: Secondary | ICD-10-CM

## 2019-01-25 MED ORDER — AZITHROMYCIN 250 MG PO TABS: ORAL_TABLET | ORAL | 1 refills | Status: DC

## 2019-01-25 MED ORDER — HYDROCODONE-HOMATROPINE 5-1.5 MG/5ML PO SYRP: 5.0000 mL | ORAL_SOLUTION | Freq: Four times a day (QID) | ORAL | 0 refills | Status: DC | PRN

## 2019-01-25 NOTE — Progress Notes (Signed)
Patient ID: Peter Leblanc, male   DOB: Nov 04, 1955, 63 y.o.   MRN: [id number]  Virtual Visit via Video Note  I connected with Philipp Deputy on 01/25/19 at  3:00 PM EDT by a video enabled telemedicine application and verified that I am speaking with the correct person using two identifiers.  Location: Patient: at home Provider: at office   I discussed the limitations of evaluation and management by telemedicine and the availability of in person appointments. The patient expressed understanding and agreed to proceed.  History of Present Illness:  Here with 2-3 days acute onset fever, facial pain, pressure, headache, general weakness and malaise, and several tender swollen LNs, with mild ST and slight prod cough not sure if any color, but pt denies chest pain, wheezing, increased sob or doe, orthopnea, PND, increased LE swelling, palpitations, dizziness or syncope.  Has no known COVID exposure but would like to be tested Pt denies new neurological symptoms such as new headache, or facial or extremity weakness or numbness   Pt denies polydipsia, polyuria. Denies worsening depressive symptoms, suicidal ideation, or panic; has ongoing anxiety, not increased recently.  Past Medical History:  Diagnosis Date  . ALLERGIC RHINITIS 02/15/2007  . Anxiety 08/23/2011  . BPH (benign prostatic hypertrophy) with urinary obstruction 11/16/2012  . CONJUNCTIVITIS, INFECTIOUS 09/04/2008  . ERECTILE DYSFUNCTION 09/26/2008  . Genital herpes   . HTN (hypertension) 12/06/2015  . HYPERLIPIDEMIA 11/08/2007  . KNEE PAIN, BILATERAL 11/29/2009  . PSA, INCREASED 11/08/2007  . SKIN LESION 11/29/2009  . Slowing of urinary stream 09/04/2008  . TIA (transient ischemic attack) 12/06/2015  . VULVAR ABSCESS 11/27/2009   Past Surgical History:  Procedure Laterality Date  . GANGLION CYST EXCISION    . NASAL SINUS SURGERY  1997  . TONSILLECTOMY    . VASECTOMY      reports that he has never smoked. He has never used smokeless tobacco. He  reports current alcohol use. He reports that he does not use drugs. He was adopted. Family history is unknown by patient. No Known Allergies Current Outpatient Medications on File Prior to Visit  Medication Sig Dispense Refill  . aspirin EC 81 MG tablet Take 81 mg by mouth daily.    [name] CIALIS 5 MG tablet Take 1 tablet (5 mg total) by mouth daily. 10 tablet 6  . citalopram (CELEXA) 20 MG tablet TAKE 1 TABLET BY MOUTH EVERY DAY 90 tablet 3  . losartan (COZAAR) 25 MG tablet TAKE 1 TABLET BY MOUTH EVERY DAY 90 tablet 1  . mefloquine (LARIAM) 250 MG tablet Take 1 tablet (250 mg total) by mouth every 7 (seven) days. To start one week prior to trip 15 tablet 0  . pravastatin (PRAVACHOL) 40 MG tablet TAKE 1 TABLET BY MOUTH EVERY DAY 90 tablet 3  . tamsulosin (FLOMAX) 0.4 MG CAPS capsule TAKE ONE CAPSULE BY MOUTH TWICE A DAY 180 capsule 3   No current facility-administered medications on file prior to visit.     Observations/Objective: Alert, NAD, appropriate mood and affect, resps normal, cn 2-12 intact, moves all 4s, no visible rash or swelling Lab Results  Component Value Date   WBC 7.5 12/21/2017   HGB 15.4 12/21/2017   HCT 44.5 12/21/2017   PLT 246.0 12/21/2017   GLUCOSE 92 12/21/2017   CHOL 197 12/21/2017   TRIG 122.0 12/21/2017   HDL 61.10 12/21/2017   LDLCALC 112 (H) 12/21/2017   ALT 11 12/21/2017   AST 17 12/21/2017   NA 140  12/21/2017   K 5.0 12/21/2017   CL 104 12/21/2017   CREATININE 0.89 12/21/2017   BUN 14 12/21/2017   CO2 31 12/21/2017   TSH 1.36 12/21/2017   PSA 9.18 (H) 12/21/2017   Assessment and Plan: See notes  Follow Up Instructions: See notes   I discussed the assessment and treatment plan with the patient. The patient was provided an opportunity to ask questions and all were answered. The patient agreed with the plan and demonstrated an understanding of the instructions.   The patient was advised to call back or seek an in-person evaluation if the symptoms  worsen or if the condition fails to improve as anticipated.   Cathlean Cower, MD

## 2019-01-25 NOTE — Assessment & Plan Note (Signed)
Mild to mod, c/w viral vs bacterial bronchitis, for antibx course, cough med prn, referred for COVID testing, to f/u any worsening symptoms or concerns

## 2019-01-25 NOTE — Patient Instructions (Signed)
Please take all new medication as prescribed - the antibiotic, and cough medicine as needed  Please go to the [facility] testing site for COVID testing  Please continue all other medications as before, and refills have been done if requested.  Please have the pharmacy call with any other refills you may need.  Please keep your appointments with your specialists as you may have planned

## 2019-01-25 NOTE — Assessment & Plan Note (Signed)
stable overall by history and exam, recent data reviewed with pt, and pt to continue medical treatment as before,  to f/u any worsening symptoms or concerns  

## 2019-01-26 ENCOUNTER — Encounter: Payer: No Typology Code available for payment source | Admitting: Internal Medicine

## 2019-01-26 ENCOUNTER — Other Ambulatory Visit: Payer: Self-pay

## 2019-01-26 DIAGNOSIS — Z20822 Contact with and (suspected) exposure to covid-19: Secondary | ICD-10-CM

## 2019-01-28 LAB — NOVEL CORONAVIRUS, NAA: SARS-CoV-2, NAA: NOT DETECTED

## 2019-01-28 LAB — SPECIMEN STATUS REPORT

## 2019-02-02 ENCOUNTER — Ambulatory Visit (INDEPENDENT_AMBULATORY_CARE_PROVIDER_SITE_OTHER): Payer: No Typology Code available for payment source | Admitting: Internal Medicine

## 2019-02-02 ENCOUNTER — Other Ambulatory Visit (INDEPENDENT_AMBULATORY_CARE_PROVIDER_SITE_OTHER): Payer: No Typology Code available for payment source

## 2019-02-02 ENCOUNTER — Other Ambulatory Visit: Payer: Self-pay

## 2019-02-02 ENCOUNTER — Encounter: Payer: Self-pay | Admitting: Internal Medicine

## 2019-02-02 ENCOUNTER — Other Ambulatory Visit: Payer: Self-pay | Admitting: Internal Medicine

## 2019-02-02 VITALS — BP 116/80 | HR 92 | Temp 98.2°F | Ht 71.0 in | Wt 187.0 lb

## 2019-02-02 DIAGNOSIS — E538 Deficiency of other specified B group vitamins: Secondary | ICD-10-CM

## 2019-02-02 DIAGNOSIS — Z1211 Encounter for screening for malignant neoplasm of colon: Secondary | ICD-10-CM | POA: Diagnosis not present

## 2019-02-02 DIAGNOSIS — E611 Iron deficiency: Secondary | ICD-10-CM

## 2019-02-02 DIAGNOSIS — J309 Allergic rhinitis, unspecified: Secondary | ICD-10-CM

## 2019-02-02 DIAGNOSIS — I10 Essential (primary) hypertension: Secondary | ICD-10-CM

## 2019-02-02 DIAGNOSIS — L989 Disorder of the skin and subcutaneous tissue, unspecified: Secondary | ICD-10-CM | POA: Diagnosis not present

## 2019-02-02 DIAGNOSIS — E559 Vitamin D deficiency, unspecified: Secondary | ICD-10-CM

## 2019-02-02 DIAGNOSIS — R972 Elevated prostate specific antigen [PSA]: Secondary | ICD-10-CM | POA: Diagnosis not present

## 2019-02-02 DIAGNOSIS — Z0001 Encounter for general adult medical examination with abnormal findings: Secondary | ICD-10-CM

## 2019-02-02 DIAGNOSIS — R739 Hyperglycemia, unspecified: Secondary | ICD-10-CM

## 2019-02-02 DIAGNOSIS — E785 Hyperlipidemia, unspecified: Secondary | ICD-10-CM

## 2019-02-02 LAB — HEPATIC FUNCTION PANEL
ALT: 16 U/L (ref 0–53)
AST: 19 U/L (ref 0–37)
Albumin: 4.5 g/dL (ref 3.5–5.2)
Alkaline Phosphatase: 46 U/L (ref 39–117)
Bilirubin, Direct: 0.1 mg/dL (ref 0.0–0.3)
Total Bilirubin: 0.6 mg/dL (ref 0.2–1.2)
Total Protein: 6.7 g/dL (ref 6.0–8.3)

## 2019-02-02 LAB — VITAMIN B12: Vitamin B-12: 639 pg/mL (ref 211–911)

## 2019-02-02 LAB — CBC WITH DIFFERENTIAL/PLATELET
Basophils Absolute: 0.1 10*3/uL (ref 0.0–0.1)
Basophils Relative: 1 % (ref 0.0–3.0)
Eosinophils Absolute: 0.1 10*3/uL (ref 0.0–0.7)
Eosinophils Relative: 2 % (ref 0.0–5.0)
HCT: 43.7 % (ref 39.0–52.0)
Hemoglobin: 15.2 g/dL (ref 13.0–17.0)
Lymphocytes Relative: 31 % (ref 12.0–46.0)
Lymphs Abs: 2.3 10*3/uL (ref 0.7–4.0)
MCHC: 34.9 g/dL (ref 30.0–36.0)
MCV: 95.7 fl (ref 78.0–100.0)
Monocytes Absolute: 0.8 10*3/uL (ref 0.1–1.0)
Monocytes Relative: 11.3 % (ref 3.0–12.0)
Neutro Abs: 4 10*3/uL (ref 1.4–7.7)
Neutrophils Relative %: 54.7 % (ref 43.0–77.0)
Platelets: 237 10*3/uL (ref 150.0–400.0)
RBC: 4.56 Mil/uL (ref 4.22–5.81)
RDW: 12.8 % (ref 11.5–15.5)
WBC: 7.3 10*3/uL (ref 4.0–10.5)

## 2019-02-02 LAB — URINALYSIS, ROUTINE W REFLEX MICROSCOPIC
Bilirubin Urine: NEGATIVE
Hgb urine dipstick: NEGATIVE
Ketones, ur: NEGATIVE
Leukocytes,Ua: NEGATIVE
Nitrite: NEGATIVE
RBC / HPF: NONE SEEN (ref 0–?)
Specific Gravity, Urine: 1.02 (ref 1.000–1.030)
Total Protein, Urine: NEGATIVE
Urine Glucose: NEGATIVE
Urobilinogen, UA: 0.2 (ref 0.0–1.0)
pH: 6 (ref 5.0–8.0)

## 2019-02-02 LAB — HEMOGLOBIN A1C: Hgb A1c MFr Bld: 5.6 % (ref 4.6–6.5)

## 2019-02-02 LAB — BASIC METABOLIC PANEL
BUN: 14 mg/dL (ref 6–23)
CO2: 27 mEq/L (ref 19–32)
Calcium: 9.5 mg/dL (ref 8.4–10.5)
Chloride: 105 mEq/L (ref 96–112)
Creatinine, Ser: 0.84 mg/dL (ref 0.40–1.50)
GFR: 92.48 mL/min (ref >60.00)
Glucose, Bld: 60 mg/dL — ABNORMAL LOW (ref 70–99)
Potassium: 4.2 mEq/L (ref 3.5–5.1)
Sodium: 139 mEq/L (ref 135–145)

## 2019-02-02 LAB — IBC PANEL
Iron: 133 ug/dL (ref 42–165)
Saturation Ratios: 42.4 % (ref 20.0–50.0)
Transferrin: 224 mg/dL (ref 212.0–360.0)

## 2019-02-02 LAB — LIPID PANEL
Cholesterol: 222 mg/dL — ABNORMAL HIGH (ref 0–200)
HDL: 60.6 mg/dL (ref >39.00)
LDL Cholesterol: 141 mg/dL — ABNORMAL HIGH (ref 0–99)
NonHDL: 161.07
Total CHOL/HDL Ratio: 4
Triglycerides: 101 mg/dL (ref 0.0–149.0)
VLDL: 20.2 mg/dL (ref 0.0–40.0)

## 2019-02-02 LAB — TSH: TSH: 0.97 u[IU]/mL (ref 0.35–4.50)

## 2019-02-02 LAB — PSA: PSA: 8.7 ng/mL — ABNORMAL HIGH (ref 0.10–4.00)

## 2019-02-02 LAB — VITAMIN D 25 HYDROXY (VIT D DEFICIENCY, FRACTURES): VITD: 30.38 ng/mL (ref 30.00–100.00)

## 2019-02-02 MED ORDER — TRIAMCINOLONE ACETONIDE 55 MCG/ACT NA AERO: 2.0000 | INHALATION_SPRAY | Freq: Every day | NASAL | 3 refills | Status: DC

## 2019-02-02 MED ORDER — LEVOCETIRIZINE DIHYDROCHLORIDE 5 MG PO TABS: 5.0000 mg | ORAL_TABLET | Freq: Every evening | ORAL | 3 refills | Status: DC

## 2019-02-02 MED ORDER — ATORVASTATIN CALCIUM 20 MG PO TABS: 20.0000 mg | ORAL_TABLET | Freq: Every day | ORAL | 3 refills | Status: DC

## 2019-02-02 NOTE — Progress Notes (Signed)
Subjective:    Patient ID: Peter Leblanc, male    DOB: Dec 24, 1955, 63 y.o.   MRN: [id number]  HPI   Here for wellness and f/u;  Overall doing ok;  Pt denies Chest pain, worsening SOB, DOE, wheezing, orthopnea, PND, worsening LE edema, palpitations, dizziness or syncope.  Pt denies neurological change such as new headache, facial or extremity weakness..  Pt denies worsening depressive symptoms, suicidal ideation or panic. No fever, night sweats, wt loss, loss of appetite, or other constitutional symptoms.  Pt states good ability with ADL's, has low fall risk, home safety reviewed and adequate, no other significant changes in hearing or vision, and only occasionally active with exercise. Denies urinary symptoms such as dysuria, frequency, urgency, flank pain, hematuria or n/v, fever, chills. Has urology sched next month. Does have several wks ongoing nasal allergy symptoms with clearish congestion, itch and sneezing and ST and nausea, without fever, pain, cough,swelling or wheezing.  Pt denies polydipsia, polyuria,.  Pt states overall good compliance with meds, trying to follow lower cholesterol, diabetic diet, wt overall stable but little exercise however recently. Also has a new skin lesion to left anterior chest x 3 mo, slightly raised, nontender, at least part black, nothing makes better or worse Past Medical History:  Diagnosis Date  . ALLERGIC RHINITIS 02/15/2007  . Anxiety 08/23/2011  . BPH (benign prostatic hypertrophy) with urinary obstruction 11/16/2012  . CONJUNCTIVITIS, INFECTIOUS 09/04/2008  . ERECTILE DYSFUNCTION 09/26/2008  . Genital herpes   . HTN (hypertension) 12/06/2015  . HYPERLIPIDEMIA 11/08/2007  . KNEE PAIN, BILATERAL 11/29/2009  . PSA, INCREASED 11/08/2007  . SKIN LESION 11/29/2009  . Slowing of urinary stream 09/04/2008  . TIA (transient ischemic attack) 12/06/2015  . VULVAR ABSCESS 11/27/2009   Past Surgical History:  Procedure Laterality Date  . GANGLION CYST EXCISION    .  NASAL SINUS SURGERY  1997  . TONSILLECTOMY    . VASECTOMY      reports that he has never smoked. He has never used smokeless tobacco. He reports current alcohol use. He reports that he does not use drugs. He was adopted. Family history is unknown by patient. No Known Allergies Current Outpatient Medications on File Prior to Visit  Medication Sig Dispense Refill  . CIALIS 5 MG tablet Take 1 tablet (5 mg total) by mouth daily. 10 tablet 6  . losartan (COZAAR) 25 MG tablet TAKE 1 TABLET BY MOUTH EVERY DAY 90 tablet 1  . tamsulosin (FLOMAX) 0.4 MG CAPS capsule TAKE ONE CAPSULE BY MOUTH TWICE A DAY 180 capsule 3  . aspirin EC 81 MG tablet Take 81 mg by mouth daily.    . citalopram (CELEXA) 20 MG tablet TAKE 1 TABLET BY MOUTH EVERY DAY (Patient not taking: Reported on 02/02/2019) 90 tablet 3   No current facility-administered medications on file prior to visit.    Review of Systems Constitutional: Negative for other unusual diaphoresis, sweats, appetite or weight changes HENT: Negative for other worsening hearing loss, ear pain, facial swelling, mouth sores or neck stiffness.   Eyes: Negative for other worsening pain, redness or other visual disturbance.  Respiratory: Negative for other stridor or swelling Cardiovascular: Negative for other palpitations or other chest pain  Gastrointestinal: Negative for worsening diarrhea or loose stools, blood in stool, distention or other pain Genitourinary: Negative for hematuria, flank pain or other change in urine volume.  Musculoskeletal: Negative for myalgias or other joint swelling.  Skin: Negative for other color change, or other wound  or worsening drainage.  Neurological: Negative for other syncope or numbness. Hematological: Negative for other adenopathy or swelling Psychiatric/Behavioral: Negative for hallucinations, other worsening agitation, SI, self-injury, or new decreased concentration All other system neg per pt    Objective:   Physical  Exam BP 116/80 (BP Location: Left Arm, Patient Position: Sitting, Cuff Size: Normal)   Pulse 92   Temp 98.2 F (36.8 C) (Oral)   Ht 5\' 11"  (1.803 m)   Wt 187 lb 0.2 oz (84.8 kg)   SpO2 96%   BMI 26.08 kg/m  VS noted,  Constitutional: Pt is oriented to person, place, and time. Appears well-developed and well-nourished, in no significant distress and comfortable Head: Normocephalic and atraumatic  Eyes: Conjunctivae and EOM are normal. Pupils are equal, round, and reactive to light Bilat tm's with mild erythema.  Max sinus areas non tender.  Pharynx with mild erythema, no exudate Right Ear: External ear normal without discharge Left Ear: External ear normal without discharge Nose: Nose without discharge or deformity Mouth/Throat: Oropharynx is without other ulcerations and moist  Neck: Normal range of motion. Neck supple. No JVD present. No tracheal deviation present or significant neck LA or mass Cardiovascular: Normal rate, regular rhythm, normal heart sounds and intact distal pulses.   Pulmonary/Chest: WOB normal and breath sounds without rales or wheezing  Abdominal: Soft. Bowel sounds are normal. NT. No HSM  Musculoskeletal: Normal range of motion. Exhibits no edema Lymphadenopathy: Has no other cervical adenopathy.  Neurological: Pt is alert and oriented to person, place, and time. Pt has normal reflexes. No cranial nerve deficit. Motor grossly intact, Gait intact Skin: Skin is warm and dry. No rash , has 10 mm slight raised mostly regular lesion nontender partly black/dark part off tan coloration Psychiatric:  Has normal mood and affect. Behavior is normal without agitation No other exam findings Lab Results  Component Value Date   WBC 7.3 02/02/2019   HGB 15.2 02/02/2019   HCT 43.7 02/02/2019   PLT 237.0 02/02/2019   GLUCOSE 60 (L) 02/02/2019   CHOL 222 (H) 02/02/2019   TRIG 101.0 02/02/2019   HDL 60.60 02/02/2019   LDLCALC 141 (H) 02/02/2019   ALT 16 02/02/2019   AST  19 02/02/2019   NA 139 02/02/2019   K 4.2 02/02/2019   CL 105 02/02/2019   CREATININE 0.84 02/02/2019   BUN 14 02/02/2019   CO2 27 02/02/2019   TSH 0.97 02/02/2019   PSA 8.70 (H) 02/02/2019   HGBA1C 5.6 02/02/2019       Assessment & Plan:

## 2019-02-02 NOTE — Patient Instructions (Addendum)
Please take all new medication as prescribed  - the antihistamine, and nasal spray for allergies  You will be contacted regarding the referral for: colonoscopy, and dermatology  Please continue all other medications as before, and refills have been done if requested.  Please have the pharmacy call with any other refills you may need.  Please continue your efforts at being more active, low cholesterol diet, and weight control.  You are otherwise up to date with prevention measures today.  Please keep your appointments with your specialists as you may have planned - urology next month  Please return in 1 year for your yearly visit, or sooner if needed, with Lab testing done 3-5 days before

## 2019-02-03 ENCOUNTER — Telehealth: Payer: Self-pay

## 2019-02-03 NOTE — Telephone Encounter (Signed)
-----   Message from Biagio Borg, MD sent at 02/02/2019  4:52 PM EDT ----- Left message on MyChart, pt to cont same tx except  The test results show that your current treatment is OK, as the tests are stable except the LDL cholesterol is moderately elevated, and the PSA is still elevated.  Please change the pravastatin to a more effective lipitor 20 mg per day, and make sure to follow up with your urologist as you have planned   Shirron to please inform pt, I will do rx

## 2019-02-03 NOTE — Telephone Encounter (Signed)
Pt has viewed results via MyChart  

## 2019-02-05 ENCOUNTER — Encounter: Payer: Self-pay | Admitting: Internal Medicine

## 2019-02-05 NOTE — Assessment & Plan Note (Signed)
Mild to mod, for add xyzal and nasacort asd  to f/u any worsening symptoms or concerns

## 2019-02-05 NOTE — Assessment & Plan Note (Signed)
stable overall by history and exam, recent data reviewed with pt, and pt to continue medical treatment as before,  to f/u any worsening symptoms or concerns  

## 2019-02-05 NOTE — Assessment & Plan Note (Addendum)
Not irregular edges but o/w somewhat suspiciou for melanoma, for derm referral  In addition to the time spent performing CPE, I spent an additional 25 minutes face to face,in which greater than 50% of this time was spent in counseling and coordination of care for patient's acute illness as documented, including the differential dx, treatment, further evaluation and other management of skin lesion, allergic rhinitis, elevated PSA, hyperglycemia, HTN, HLD

## 2019-02-05 NOTE — Assessment & Plan Note (Signed)
Asympt, for f/u psa and urology as planned

## 2019-02-05 NOTE — Assessment & Plan Note (Signed)

## 2019-02-10 ENCOUNTER — Encounter: Payer: Self-pay | Admitting: Gastroenterology

## 2019-03-08 ENCOUNTER — Ambulatory Visit (AMBULATORY_SURGERY_CENTER): Payer: Self-pay | Admitting: *Deleted

## 2019-03-08 ENCOUNTER — Other Ambulatory Visit: Payer: Self-pay

## 2019-03-08 VITALS — Temp 97.3°F | Ht 70.0 in | Wt 186.0 lb

## 2019-03-08 DIAGNOSIS — Z1211 Encounter for screening for malignant neoplasm of colon: Secondary | ICD-10-CM

## 2019-03-08 MED ORDER — NA SULFATE-K SULFATE-MG SULF 17.5-3.13-1.6 GM/177ML PO SOLN: ORAL | 0 refills | Status: DC

## 2019-03-08 NOTE — Progress Notes (Signed)
Patient is here in-person for PV. Patient denies any allergies to eggs or soy. Patient denies any problems with anesthesia/sedation. Patient denies any oxygen use at home. Patient denies taking any diet/weight loss medications or blood thinners. Patient is not being treated for MRSA or C-diff. EMMI education assisgned to patient on colonoscopy, this was explained and instructions given to patient. Suprep 15 36ff coupon given to pt.   Pt is aware that care partner will wait in the car during procedure; if they feel like they will be too hot to wait in the car; they may wait in the lobby.  We want them to wear a mask (we do not have any that we can provide them), practice social distancing, and we will check their temperatures when they get here.  I did remind patient that their care partner needs to stay in the parking lot the entire time. Pt will wear mask into building.

## 2019-03-09 ENCOUNTER — Encounter: Payer: Self-pay | Admitting: Gastroenterology

## 2019-03-21 ENCOUNTER — Telehealth: Payer: Self-pay

## 2019-03-21 NOTE — Telephone Encounter (Signed)
Covid-19 screening questions   Do you now or have you had a fever in the last 14 days? NO   Do you have any respiratory symptoms of shortness of breath or cough now or in the last 14 days? NO  Do you have any family members or close contacts with diagnosed or suspected Covid-19 in the past 14 days? NO  Have you been tested for Covid-19 and found to be positive? NO        

## 2019-03-22 ENCOUNTER — Ambulatory Visit (AMBULATORY_SURGERY_CENTER): Payer: No Typology Code available for payment source | Admitting: Gastroenterology

## 2019-03-22 ENCOUNTER — Encounter: Payer: Self-pay | Admitting: Gastroenterology

## 2019-03-22 ENCOUNTER — Other Ambulatory Visit: Payer: Self-pay

## 2019-03-22 VITALS — BP 120/77 | HR 63 | Temp 98.1°F | Resp 13 | Ht 70.0 in | Wt 186.0 lb

## 2019-03-22 DIAGNOSIS — Z1211 Encounter for screening for malignant neoplasm of colon: Secondary | ICD-10-CM | POA: Diagnosis present

## 2019-03-22 DIAGNOSIS — D122 Benign neoplasm of ascending colon: Secondary | ICD-10-CM

## 2019-03-22 DIAGNOSIS — K5140 Inflammatory polyps of colon without complications: Secondary | ICD-10-CM

## 2019-03-22 MED ORDER — SODIUM CHLORIDE 0.9 % IV SOLN: 500.0000 mL | Freq: Once | INTRAVENOUS | Status: DC

## 2019-03-22 NOTE — Progress Notes (Signed)
Called to room to assist during endoscopic procedure.  Patient ID and intended procedure confirmed with present staff. Received instructions for my participation in the procedure from the performing physician.  

## 2019-03-22 NOTE — Op Note (Signed)
[facility] Patient Name: Peter Leblanc Procedure Date: 03/22/2019 2:43 PM MRN: [id number] Endoscopist: Mallie Mussel L. Loletha Carrow , MD Age: 63 Referring MD:  Date of Birth: Feb 26, 1956 Gender: Male Account #: [id number] Procedure:                Colonoscopy Indications:              Screening for colorectal malignant neoplasm (no                            polyps 11/2006) Medicines:                Monitored Anesthesia Care Procedure:                Pre-Anesthesia Assessment:                           - Prior to the procedure, a History and Physical                            was performed, and patient medications and                            allergies were reviewed. The patient's tolerance of                            previous anesthesia was also reviewed. The risks                            and benefits of the procedure and the sedation                            options and risks were discussed with the patient.                            All questions were answered, and informed consent                            was obtained. Prior Anticoagulants: The patient has                            taken no previous anticoagulant or antiplatelet                            agents except for aspirin. ASA Grade Assessment: II                            - A patient with mild systemic disease. After                            reviewing the risks and benefits, the patient was                            deemed in satisfactory condition to undergo the  procedure.                           After obtaining informed consent, the colonoscope                            was passed under direct vision. Throughout the                            procedure, the patient's blood pressure, pulse, and                            oxygen saturations were monitored continuously. The                            Colonoscope was introduced through the anus and   advanced to the the cecum, identified by                            appendiceal orifice and ileocecal valve. The                            colonoscopy was performed without difficulty. The                            patient tolerated the procedure well. The quality                            of the bowel preparation was good. The ileocecal                            valve, appendiceal orifice, and rectum were                            photographed. Scope In: 2:56:53 PM Scope Out: 3:12:32 PM Scope Withdrawal Time: 0 hours 11 minutes 13 seconds  Total Procedure Duration: 0 hours 15 minutes 39 seconds  Findings:                 The perianal and digital rectal examinations were                            normal.                           A diminutive polyp was found in the ascending                            colon. The polyp was sessile. The polyp was removed                            with a cold snare. Resection and retrieval were                            complete.  The exam was otherwise without abnormality on                            direct and retroflexion views. Complications:            No immediate complications. Estimated Blood Loss:     Estimated blood loss was minimal. Impression:               - One diminutive polyp in the ascending colon,                            removed with a cold snare. Resected and retrieved.                           - The examination was otherwise normal on direct                            and retroflexion views. Recommendation:           - Patient has a contact number available for                            emergencies. The signs and symptoms of potential                            delayed complications were discussed with the                            patient. Return to normal activities tomorrow.                            Written discharge instructions were provided to the                            patient.                            - Resume previous diet.                           - Continue present medications.                           - Await pathology results.                           - Repeat colonoscopy is recommended for                            surveillance. The colonoscopy date will be                            determined after pathology results from today's                            exam become available for review. Bettie Swavely L. Danis, MD 03/22/2019 3:15:53 PM This report has been  signed electronically.

## 2019-03-22 NOTE — Patient Instructions (Signed)
Thank you for allowing [place] to car for you today.  Await pathology results by mail, approximately 2 weeks. Will recommend next screening colonoscopy at that time.  Resume previous diet and medications today.  Return to normal activities tomorrow.    YOU HAD AN ENDOSCOPIC PROCEDURE TODAY AT [facility]:   Refer to the procedure report that was given to you for any specific questions about what was found during the examination.  If the procedure report does not answer your questions, please call your gastroenterologist to clarify.  If you requested that your care partner not be given the details of your procedure findings, then the procedure report has been included in a sealed envelope for you to review at your convenience later.  YOU SHOULD EXPECT: Some feelings of bloating in the abdomen. Passage of more gas than usual.  Walking can help get rid of the air that was put into your GI tract during the procedure and reduce the bloating. If you had a lower endoscopy (such as a colonoscopy or flexible sigmoidoscopy) you may notice spotting of blood in your stool or on the toilet paper. If you underwent a bowel prep for your procedure, you may not have a normal bowel movement for a few days.  Please Note:  You might notice some irritation and congestion in your nose or some drainage.  This is from the oxygen used during your procedure.  There is no need for concern and it should clear up in a day or so.  SYMPTOMS TO REPORT IMMEDIATELY:   Following lower endoscopy (colonoscopy or flexible sigmoidoscopy):  Excessive amounts of blood in the stool  Significant tenderness or worsening of abdominal pains  Swelling of the abdomen that is new, acute  Fever of 100F or higher    For urgent or emergent issues, a gastroenterologist can be reached at any hour by calling [phone].   DIET:  We do recommend a small meal at first, but then you may proceed to your regular diet.  Drink  plenty of fluids but you should avoid alcoholic beverages for 24 hours.  ACTIVITY:  You should plan to take it easy for the rest of today and you should NOT DRIVE or use heavy machinery until tomorrow (because of the sedation medicines used during the test).    FOLLOW UP: Our staff will call the number listed on your records 48-72 hours following your procedure to check on you and address any questions or concerns that you may have regarding the information given to you following your procedure. If we do not reach you, we will leave a message.  We will attempt to reach you two times.  During this call, we will ask if you have developed any symptoms of COVID 19. If you develop any symptoms (ie: fever, flu-like symptoms, shortness of breath, cough etc.) before then, please call [phone].  If you test positive for Covid 19 in the 2 weeks post procedure, please call and report this information to [place].    If any biopsies were taken you will be contacted by phone or by letter within the next 1-3 weeks.  Please call us at [phone] if you have not heard about the biopsies in 3 weeks.    SIGNATURES/CONFIDENTIALITY: You and/or your care partner have signed paperwork which will be entered into your electronic medical record.  These signatures attest to the fact that that the information above on your After Visit Summary has been reviewed and is  understood.  Full responsibility of the confidentiality of this discharge information lies with you and/or your care-partner. 

## 2019-03-22 NOTE — Progress Notes (Signed)
Report given to PACU, vss 

## 2019-03-24 ENCOUNTER — Telehealth: Payer: Self-pay

## 2019-03-24 NOTE — Telephone Encounter (Signed)
  Follow up Call-  Call back number 03/22/2019  Post procedure Call Back phone  # [phone]  Permission to leave phone message Yes  Some recent data might be hidden     Patient questions:  Do you have a fever, pain , or abdominal swelling? No. Pain Score  0 *  Have you tolerated food without any problems? Yes.    Have you been able to return to your normal activities? Yes.    Do you have any questions about your discharge instructions: Diet   No. Medications  No. Follow up visit  No.  Do you have questions or concerns about your Care? No.  Actions: * If pain score is 4 or above: No action needed, pain <4. 1. Have you developed a fever since your procedure? no  2.   Have you had an respiratory symptoms (SOB or cough) since your procedure? no  3.   Have you tested positive for COVID 19 since your procedure no  4.   Have you had any family members/close contacts diagnosed with the COVID 19 since your procedure?  no   If yes to any of these questions please route to Joylene John, RN and Alphonsa Gin, Therapist, sports.

## 2019-03-24 NOTE — Telephone Encounter (Signed)
Attempted to reach patient for post-procedure f/u call. No answer. Left message that we will make another to reach him again later today and for him to please not hesitate to call us if he has any questions/concerns regarding his care.

## 2019-03-27 ENCOUNTER — Encounter: Payer: Self-pay | Admitting: Gastroenterology

## 2019-04-12 ENCOUNTER — Encounter: Payer: Self-pay | Admitting: Internal Medicine

## 2019-09-25 ENCOUNTER — Other Ambulatory Visit: Payer: Self-pay | Admitting: Internal Medicine

## 2019-09-26 NOTE — Telephone Encounter (Signed)
Please refill as per office routine med refill policy (all routine meds refilled for 3 mo or monthly per pt preference up to one year from last visit, then month to month grace period for 3 mo, then further med refills will have to be denied)  

## 2019-09-28 ENCOUNTER — Other Ambulatory Visit: Payer: Self-pay

## 2019-09-28 ENCOUNTER — Encounter: Payer: Self-pay | Admitting: Internal Medicine

## 2019-09-28 ENCOUNTER — Ambulatory Visit: Payer: No Typology Code available for payment source | Admitting: Internal Medicine

## 2019-09-28 VITALS — BP 118/78 | HR 98 | Temp 98.1°F | Ht 70.0 in | Wt 189.4 lb

## 2019-09-28 DIAGNOSIS — R739 Hyperglycemia, unspecified: Secondary | ICD-10-CM

## 2019-09-28 DIAGNOSIS — Z0001 Encounter for general adult medical examination with abnormal findings: Secondary | ICD-10-CM | POA: Diagnosis not present

## 2019-09-28 DIAGNOSIS — E559 Vitamin D deficiency, unspecified: Secondary | ICD-10-CM

## 2019-09-28 DIAGNOSIS — F419 Anxiety disorder, unspecified: Secondary | ICD-10-CM | POA: Diagnosis not present

## 2019-09-28 DIAGNOSIS — I10 Essential (primary) hypertension: Secondary | ICD-10-CM | POA: Diagnosis not present

## 2019-09-28 DIAGNOSIS — Z0000 Encounter for general adult medical examination without abnormal findings: Secondary | ICD-10-CM

## 2019-09-28 DIAGNOSIS — E538 Deficiency of other specified B group vitamins: Secondary | ICD-10-CM

## 2019-09-28 LAB — LIPID PANEL
Cholesterol: 151 mg/dL (ref 0–200)
HDL: 44.7 mg/dL (ref >39.00)
LDL Cholesterol: 77 mg/dL (ref 0–99)
NonHDL: 105.89
Total CHOL/HDL Ratio: 3
Triglycerides: 144 mg/dL (ref 0.0–149.0)
VLDL: 28.8 mg/dL (ref 0.0–40.0)

## 2019-09-28 LAB — HEPATIC FUNCTION PANEL
ALT: 21 U/L (ref 0–53)
AST: 22 U/L (ref 0–37)
Albumin: 4.1 g/dL (ref 3.5–5.2)
Alkaline Phosphatase: 51 U/L (ref 39–117)
Bilirubin, Direct: 0.1 mg/dL (ref 0.0–0.3)
Total Bilirubin: 0.4 mg/dL (ref 0.2–1.2)
Total Protein: 6.1 g/dL (ref 6.0–8.3)

## 2019-09-28 LAB — CBC WITH DIFFERENTIAL/PLATELET
Basophils Absolute: 0 10*3/uL (ref 0.0–0.1)
Basophils Relative: 0.6 % (ref 0.0–3.0)
Eosinophils Absolute: 0.3 10*3/uL (ref 0.0–0.7)
Eosinophils Relative: 4.4 % (ref 0.0–5.0)
HCT: 40.2 % (ref 39.0–52.0)
Hemoglobin: 14.2 g/dL (ref 13.0–17.0)
Lymphocytes Relative: 28.4 % (ref 12.0–46.0)
Lymphs Abs: 2 10*3/uL (ref 0.7–4.0)
MCHC: 35.4 g/dL (ref 30.0–36.0)
MCV: 95.2 fl (ref 78.0–100.0)
Monocytes Absolute: 0.6 10*3/uL (ref 0.1–1.0)
Monocytes Relative: 8.3 % (ref 3.0–12.0)
Neutro Abs: 4.2 10*3/uL (ref 1.4–7.7)
Neutrophils Relative %: 58.3 % (ref 43.0–77.0)
Platelets: 239 10*3/uL (ref 150.0–400.0)
RBC: 4.22 Mil/uL (ref 4.22–5.81)
RDW: 12.3 % (ref 11.5–15.5)
WBC: 7.2 10*3/uL (ref 4.0–10.5)

## 2019-09-28 LAB — URINALYSIS, ROUTINE W REFLEX MICROSCOPIC
Bilirubin Urine: NEGATIVE
Hgb urine dipstick: NEGATIVE
Ketones, ur: NEGATIVE
Leukocytes,Ua: NEGATIVE
Nitrite: NEGATIVE
RBC / HPF: NONE SEEN (ref 0–?)
Specific Gravity, Urine: >=1.03 — AB (ref 1.000–1.030)
Total Protein, Urine: NEGATIVE
Urine Glucose: NEGATIVE
Urobilinogen, UA: 0.2 (ref 0.0–1.0)
pH: 5.5 (ref 5.0–8.0)

## 2019-09-28 LAB — TSH: TSH: 1.22 u[IU]/mL (ref 0.35–4.50)

## 2019-09-28 LAB — BASIC METABOLIC PANEL
BUN: 16 mg/dL (ref 6–23)
CO2: 27 mEq/L (ref 19–32)
Calcium: 9.1 mg/dL (ref 8.4–10.5)
Chloride: 105 mEq/L (ref 96–112)
Creatinine, Ser: 0.9 mg/dL (ref 0.40–1.50)
GFR: 85.23 mL/min (ref >60.00)
Glucose, Bld: 160 mg/dL — ABNORMAL HIGH (ref 70–99)
Potassium: 3.9 mEq/L (ref 3.5–5.1)
Sodium: 140 mEq/L (ref 135–145)

## 2019-09-28 LAB — HEMOGLOBIN A1C: Hgb A1c MFr Bld: 5.7 % (ref 4.6–6.5)

## 2019-09-28 LAB — VITAMIN D 25 HYDROXY (VIT D DEFICIENCY, FRACTURES): VITD: 24.93 ng/mL — ABNORMAL LOW (ref 30.00–100.00)

## 2019-09-28 LAB — PSA: PSA: 7.82 ng/mL — ABNORMAL HIGH (ref 0.10–4.00)

## 2019-09-28 LAB — VITAMIN B12: Vitamin B-12: 617 pg/mL (ref 211–911)

## 2019-09-28 MED ORDER — TRIAMCINOLONE ACETONIDE 55 MCG/ACT NA AERO: 2.0000 | INHALATION_SPRAY | Freq: Every day | NASAL | 3 refills | Status: DC

## 2019-09-28 MED ORDER — LOSARTAN POTASSIUM 50 MG PO TABS: 50.0000 mg | ORAL_TABLET | Freq: Every day | ORAL | 3 refills | Status: DC

## 2019-09-28 NOTE — Assessment & Plan Note (Signed)
Stable off the celexa,  to f/u any worsening symptoms or concerns

## 2019-09-28 NOTE — Assessment & Plan Note (Signed)
Now stable but has variable UE BP's and advised to always let others know his more accurate bp is in the LUE

## 2019-09-28 NOTE — Assessment & Plan Note (Signed)

## 2019-09-28 NOTE — Patient Instructions (Addendum)
Ok to continue the losartan at 50 mg per day  Please continue all other medications as before, and refills have been done if requested.  Please have the pharmacy call with any other refills you may need.  Please continue your efforts at being more active, low cholesterol diet, and weight control.  You are otherwise up to date with prevention measures today.  Please keep your appointments with your specialists as you may have planned  Please go to the LAB at the blood drawing area for the tests to be done  You will be contacted by phone if any changes need to be made immediately.  Otherwise, you will receive a letter about your results with an explanation, but please check with MyChart first.  Please remember to sign up for MyChart if you have not done so, as this will be important to you in the future with finding out test results, communicating by private email, and scheduling acute appointments online when needed.  Please make an Appointment to return for your 1 year visit, or sooner if needed

## 2019-09-28 NOTE — Progress Notes (Signed)
Subjective:    Patient ID: Peter Leblanc, male    DOB: 05-23-1956, 64 y.o.   MRN: [id number]  HPI  Here for wellness and f/u;  Overall doing ok;  Pt denies Chest pain, worsening SOB, DOE, wheezing, orthopnea, PND, worsening LE edema, palpitations, dizziness or syncope.  Pt denies neurological change such as new headache, facial or extremity weakness.  Pt denies polydipsia, polyuria, or low sugar symptoms. Pt states overall good compliance with treatment and medications, good tolerability, and has been trying to follow appropriate diet.  Pt denies worsening depressive symptoms, suicidal ideation or panic, and weaned himself off the celexa. No fever, night sweats, wt loss, loss of appetite, or other constitutional symptoms.  Pt states good ability with ADL's, has low fall risk, home safety reviewed and adequate, no other significant changes in hearing or vision, and only occasionally active with exercise.  BP elevated at home last wk, but since then stable at 130/90 after he increased his losartan to 50 mg.  Today has BP higher on right arm than left, not noticed before Past Medical History:  Diagnosis Date  . ALLERGIC RHINITIS 02/15/2007  . Allergy   . Anxiety 08/23/2011  . BPH (benign prostatic hypertrophy) with urinary obstruction 11/16/2012  . CONJUNCTIVITIS, INFECTIOUS 09/04/2008  . ERECTILE DYSFUNCTION 09/26/2008  . Genital herpes   . HTN (hypertension) 12/06/2015  . HYPERLIPIDEMIA 11/08/2007  . KNEE PAIN, BILATERAL 11/29/2009  . PSA, INCREASED 11/08/2007  . SKIN LESION 11/29/2009  . Slowing of urinary stream 09/04/2008  . TIA (transient ischemic attack) 12/06/2015  . VULVAR ABSCESS 11/27/2009   Past Surgical History:  Procedure Laterality Date  . COLONOSCOPY  12/02/2006   w/[facility]   . GANGLION CYST EXCISION    . NASAL SINUS SURGERY  1997  . PROSTATE BIOPSY    . TONSILLECTOMY    . VASECTOMY      reports that he has never smoked. He has never used smokeless tobacco. He reports current alcohol  use of about 14.0 standard drinks of alcohol per week. He reports that he does not use drugs. He was adopted. Family history is unknown by patient. No Known Allergies Current Outpatient Medications on File Prior to Visit  Medication Sig Dispense Refill  . aspirin EC 81 MG tablet Take 81 mg by mouth daily.    [name] atorvastatin (LIPITOR) 20 MG tablet Take 1 tablet (20 mg total) by mouth daily. 90 tablet 3  . CIALIS 5 MG tablet Take 1 tablet (5 mg total) by mouth daily. 10 tablet 6  . levocetirizine (XYZAL) 5 MG tablet Take 1 tablet (5 mg total) by mouth every evening. 90 tablet 3  . tamsulosin (FLOMAX) 0.4 MG CAPS capsule TAKE ONE CAPSULE BY MOUTH TWICE A DAY 180 capsule 3  . TURMERIC PO Take by mouth.     No current facility-administered medications on file prior to visit.   Review of Systems All otherwise neg per pt    Objective:   Physical Exam BP 118/78 Comment: left arm  Pulse 98   Temp 98.1 F (36.7 C)   Ht 5\' 10"  (1.778 m)   Wt 189 lb 6.4 oz (85.9 kg)   SpO2 100%   BMI 27.18 kg/m  VS noted,  Constitutional: Pt appears in NAD HENT: Head: NCAT.  Right Ear: External ear normal.  Left Ear: External ear normal.  Eyes: . Pupils are equal, round, and reactive to light. Conjunctivae and EOM are normal Nose: without d/c or deformity Neck:  Neck supple. Gross normal ROM Cardiovascular: Normal rate and regular rhythm.   Pulmonary/Chest: Effort normal and breath sounds without rales or wheezing.  Abd:  Soft, NT, ND, + BS, no organomegaly Neurological: Pt is alert. At baseline orientation, motor grossly intact Skin: Skin is warm. No rashes, other new lesions, no LE edema Psychiatric: Pt behavior is normal without agitation  All otherwise neg per pt Lab Results  Component Value Date   WBC 7.2 09/28/2019   HGB 14.2 09/28/2019   HCT 40.2 09/28/2019   PLT 239.0 09/28/2019   GLUCOSE 160 (H) 09/28/2019   CHOL 151 09/28/2019   TRIG 144.0 09/28/2019   HDL 44.70 09/28/2019   LDLCALC  77 09/28/2019   ALT 21 09/28/2019   AST 22 09/28/2019   NA 140 09/28/2019   K 3.9 09/28/2019   CL 105 09/28/2019   CREATININE 0.90 09/28/2019   BUN 16 09/28/2019   CO2 27 09/28/2019   TSH 1.22 09/28/2019   PSA 7.82 (H) 09/28/2019   HGBA1C 5.7 09/28/2019      Assessment & Plan:

## 2019-09-28 NOTE — Assessment & Plan Note (Signed)
stable overall by history and exam, recent data reviewed with pt, and pt to continue medical treatment as before,  to f/u any worsening symptoms or concerns  

## 2019-10-01 ENCOUNTER — Other Ambulatory Visit: Payer: Self-pay | Admitting: Internal Medicine

## 2019-10-01 MED ORDER — VITAMIN D (ERGOCALCIFEROL) 1.25 MG (50000 UNIT) PO CAPS: 50000.0000 [IU] | ORAL_CAPSULE | ORAL | 0 refills | Status: DC

## 2019-11-04 ENCOUNTER — Other Ambulatory Visit: Payer: Self-pay | Admitting: Internal Medicine

## 2019-11-04 NOTE — Telephone Encounter (Signed)
Please refill as per office routine med refill policy (all routine meds refilled for 3 mo or monthly per pt preference up to one year from last visit, then month to month grace period for 3 mo, then further med refills will have to be denied)  

## 2019-11-17 ENCOUNTER — Other Ambulatory Visit: Payer: Self-pay | Admitting: Internal Medicine

## 2019-11-17 NOTE — Telephone Encounter (Signed)
Please refill as per office routine med refill policy (all routine meds refilled for 3 mo or monthly per pt preference up to one year from last visit, then month to month grace period for 3 mo, then further med refills will have to be denied)  

## 2019-12-26 ENCOUNTER — Other Ambulatory Visit: Payer: Self-pay | Admitting: Internal Medicine

## 2019-12-26 NOTE — Telephone Encounter (Signed)
Please let pt know to change to OTC Vitamin D3 at 2000 units per day, indefinitely.  

## 2020-04-03 ENCOUNTER — Other Ambulatory Visit (INDEPENDENT_AMBULATORY_CARE_PROVIDER_SITE_OTHER): Payer: No Typology Code available for payment source

## 2020-04-03 ENCOUNTER — Other Ambulatory Visit: Payer: Self-pay | Admitting: Internal Medicine

## 2020-04-03 DIAGNOSIS — E785 Hyperlipidemia, unspecified: Secondary | ICD-10-CM | POA: Diagnosis not present

## 2020-04-03 DIAGNOSIS — E559 Vitamin D deficiency, unspecified: Secondary | ICD-10-CM

## 2020-04-03 DIAGNOSIS — R739 Hyperglycemia, unspecified: Secondary | ICD-10-CM | POA: Diagnosis not present

## 2020-04-03 DIAGNOSIS — R972 Elevated prostate specific antigen [PSA]: Secondary | ICD-10-CM

## 2020-04-03 DIAGNOSIS — E538 Deficiency of other specified B group vitamins: Secondary | ICD-10-CM

## 2020-04-03 LAB — CBC WITH DIFFERENTIAL/PLATELET
Basophils Absolute: 0 10*3/uL (ref 0.0–0.1)
Basophils Relative: 0.7 % (ref 0.0–3.0)
Eosinophils Absolute: 0.1 10*3/uL (ref 0.0–0.7)
Eosinophils Relative: 1.6 % (ref 0.0–5.0)
HCT: 43.4 % (ref 39.0–52.0)
Hemoglobin: 14.8 g/dL (ref 13.0–17.0)
Lymphocytes Relative: 29.7 % (ref 12.0–46.0)
Lymphs Abs: 1.8 10*3/uL (ref 0.7–4.0)
MCHC: 34.1 g/dL (ref 30.0–36.0)
MCV: 97 fl (ref 78.0–100.0)
Monocytes Absolute: 0.6 10*3/uL (ref 0.1–1.0)
Monocytes Relative: 9.7 % (ref 3.0–12.0)
Neutro Abs: 3.6 10*3/uL (ref 1.4–7.7)
Neutrophils Relative %: 58.3 % (ref 43.0–77.0)
Platelets: 198 10*3/uL (ref 150.0–400.0)
RBC: 4.48 Mil/uL (ref 4.22–5.81)
RDW: 12.8 % (ref 11.5–15.5)
WBC: 6.1 10*3/uL (ref 4.0–10.5)

## 2020-04-03 LAB — HEPATIC FUNCTION PANEL
ALT: 27 U/L (ref 0–53)
AST: 27 U/L (ref 0–37)
Albumin: 4.4 g/dL (ref 3.5–5.2)
Alkaline Phosphatase: 45 U/L (ref 39–117)
Bilirubin, Direct: 0.2 mg/dL (ref 0.0–0.3)
Total Bilirubin: 0.9 mg/dL (ref 0.2–1.2)
Total Protein: 6.6 g/dL (ref 6.0–8.3)

## 2020-04-03 LAB — VITAMIN D 25 HYDROXY (VIT D DEFICIENCY, FRACTURES): VITD: 31.33 ng/mL (ref 30.00–100.00)

## 2020-04-03 LAB — PSA: PSA: 6.93 ng/mL — ABNORMAL HIGH (ref 0.10–4.00)

## 2020-04-03 LAB — URINALYSIS, ROUTINE W REFLEX MICROSCOPIC
Bilirubin Urine: NEGATIVE
Hgb urine dipstick: NEGATIVE
Ketones, ur: NEGATIVE
Leukocytes,Ua: NEGATIVE
Nitrite: NEGATIVE
RBC / HPF: NONE SEEN (ref 0–?)
Specific Gravity, Urine: 1.015 (ref 1.000–1.030)
Total Protein, Urine: NEGATIVE
Urine Glucose: NEGATIVE
Urobilinogen, UA: 0.2 (ref 0.0–1.0)
WBC, UA: NONE SEEN (ref 0–?)
pH: 7 (ref 5.0–8.0)

## 2020-04-03 LAB — LIPID PANEL
Cholesterol: 129 mg/dL (ref 0–200)
HDL: 56.9 mg/dL (ref >39.00)
LDL Cholesterol: 64 mg/dL (ref 0–99)
NonHDL: 72.22
Total CHOL/HDL Ratio: 2
Triglycerides: 42 mg/dL (ref 0.0–149.0)
VLDL: 8.4 mg/dL (ref 0.0–40.0)

## 2020-04-03 LAB — HEMOGLOBIN A1C: Hgb A1c MFr Bld: 5.8 % (ref 4.6–6.5)

## 2020-04-03 LAB — VITAMIN B12: Vitamin B-12: 615 pg/mL (ref 211–911)

## 2020-04-03 LAB — TSH: TSH: 1.13 u[IU]/mL (ref 0.35–4.50)

## 2020-04-08 ENCOUNTER — Encounter: Payer: No Typology Code available for payment source | Admitting: Internal Medicine

## 2020-04-08 ENCOUNTER — Other Ambulatory Visit: Payer: Self-pay

## 2020-04-18 ENCOUNTER — Other Ambulatory Visit: Payer: Self-pay

## 2020-04-19 ENCOUNTER — Ambulatory Visit (INDEPENDENT_AMBULATORY_CARE_PROVIDER_SITE_OTHER): Payer: No Typology Code available for payment source | Admitting: Internal Medicine

## 2020-04-19 VITALS — BP 118/80 | HR 79 | Temp 98.0°F | Ht 70.0 in | Wt 186.0 lb

## 2020-04-19 DIAGNOSIS — R739 Hyperglycemia, unspecified: Secondary | ICD-10-CM

## 2020-04-19 DIAGNOSIS — R21 Rash and other nonspecific skin eruption: Secondary | ICD-10-CM | POA: Diagnosis not present

## 2020-04-19 DIAGNOSIS — M5431 Sciatica, right side: Secondary | ICD-10-CM

## 2020-04-19 DIAGNOSIS — J309 Allergic rhinitis, unspecified: Secondary | ICD-10-CM | POA: Diagnosis not present

## 2020-04-19 DIAGNOSIS — I10 Essential (primary) hypertension: Secondary | ICD-10-CM

## 2020-04-19 MED ORDER — GABAPENTIN 300 MG PO CAPS: ORAL_CAPSULE | ORAL | 1 refills | Status: DC

## 2020-04-19 MED ORDER — METHYLPREDNISOLONE ACETATE 80 MG/ML IJ SUSP
80.0000 mg | Freq: Once | INTRAMUSCULAR | Status: AC
  Administered 2020-04-19: 80 mg via INTRAMUSCULAR

## 2020-04-19 MED ORDER — MELOXICAM 15 MG PO TABS: ORAL_TABLET | ORAL | 3 refills | Status: DC

## 2020-04-19 NOTE — Progress Notes (Addendum)
Subjective:    Patient ID: Peter Leblanc, male    DOB: 03/10/1956, 64 y.o.   MRN: [id number]  HPI   Here to f/u; overall doing ok,  Pt denies chest pain, increasing sob or doe, wheezing, orthopnea, PND, increased LE swelling, palpitations, dizziness or syncope.  Pt denies new neurological symptoms such as new headache, or facial or extremity weakness or numbness.  Pt denies polydipsia, polyuria, or low sugar episode.  Pt states overall good compliance with meds, mostly trying to follow appropriate diet, with wt overall stable,  Wt Readings from Last 3 Encounters:  04/19/20 186 lb (84.4 kg)  09/28/19 189 lb 6.4 oz (85.9 kg)  03/22/19 186 lb (84.4 kg)   BP Readings from Last 3 Encounters:  04/19/20 118/80  09/28/19 118/78  03/22/19 120/77  Does have several wks ongoing nasal allergy symptoms with clearish congestion, itch and sneezing, without fever, pain, ST, cough, swelling or wheezing.  Also has some ? Mild feverish yesterday, no know sick contacts or recent covid exposure.  Also has itchy scaly rash to right occipital scalp under the hair, itches all night, selsun blue does not help.  Also c/o 3 wks onset intermittent right low back pain with sciiatic to the post leg to the knee only, without other RLE pain, numbness or weakness or gait change.   Past Medical History:  Diagnosis Date  . ALLERGIC RHINITIS 02/15/2007  . Allergy   . Anxiety 08/23/2011  . BPH (benign prostatic hypertrophy) with urinary obstruction 11/16/2012  . CONJUNCTIVITIS, INFECTIOUS 09/04/2008  . ERECTILE DYSFUNCTION 09/26/2008  . Genital herpes   . HTN (hypertension) 12/06/2015  . HYPERLIPIDEMIA 11/08/2007  . KNEE PAIN, BILATERAL 11/29/2009  . PSA, INCREASED 11/08/2007  . SKIN LESION 11/29/2009  . Slowing of urinary stream 09/04/2008  . TIA (transient ischemic attack) 12/06/2015  . VULVAR ABSCESS 11/27/2009   Past Surgical History:  Procedure Laterality Date  . COLONOSCOPY  12/02/2006   w/[facility]   . GANGLION CYST EXCISION     . NASAL SINUS SURGERY  1997  . PROSTATE BIOPSY    . TONSILLECTOMY    . VASECTOMY      reports that he has never smoked. He has never used smokeless tobacco. He reports current alcohol use of about 14.0 standard drinks of alcohol per week. He reports that he does not use drugs. He was adopted. Family history is unknown by patient. No Known Allergies Current Outpatient Medications on File Prior to Visit  Medication Sig Dispense Refill  . aspirin EC 81 MG tablet Take 81 mg by mouth daily.    [name] atorvastatin (LIPITOR) 20 MG tablet TAKE 1 TABLET BY MOUTH EVERY DAY 90 tablet 3  . CIALIS 5 MG tablet Take 1 tablet (5 mg total) by mouth daily. 10 tablet 6  . levocetirizine (XYZAL) 5 MG tablet TAKE 1 TABLET BY MOUTH EVERY DAY IN THE EVENING 90 tablet 3  . losartan (COZAAR) 50 MG tablet Take 1 tablet (50 mg total) by mouth daily. 90 tablet 3  . tamsulosin (FLOMAX) 0.4 MG CAPS capsule TAKE 1 CAPSULE BY MOUTH TWICE A DAY 180 capsule 3  . triamcinolone (NASACORT) 55 MCG/ACT AERO nasal inhaler Place 2 sprays into the nose daily. 3 Inhaler 3  . TURMERIC PO Take by mouth.    . Vitamin D, Ergocalciferol, (DRISDOL) 1.25 MG (50000 UNIT) CAPS capsule Take 1 capsule (50,000 Units total) by mouth every 7 (seven) days. 12 capsule 0   No current facility-administered medications  on file prior to visit.   Review of Systems All otherwise neg per pt     Objective:   Physical Exam BP 118/80 (BP Location: Left Arm, Patient Position: Sitting, Cuff Size: Large)   Pulse 79   Temp 98 F (36.7 C) (Oral)   Ht 5\' 10"  (1.778 m)   Wt 186 lb (84.4 kg)   SpO2 96%   BMI 26.69 kg/m  VS noted,  Constitutional: Pt appears in NAD HENT: Head: NCAT.  Right Ear: External ear normal.  Left Ear: External ear normal.  Eyes: . Pupils are equal, round, and reactive to light. Conjunctivae and EOM are normal Nose: without d/c or deformity Bilat tm's with mild erythema.  Max sinus areas non tender.  Pharynx with mild erythema,  no exudate Neck: Neck supple. Gross normal ROM Cardiovascular: Normal rate and regular rhythm.   Pulmonary/Chest: Effort normal and breath sounds without rales or wheezing.  Abd:  Soft, NT, ND, + BS, no organomegaly Neurological: Pt is alert. At baseline orientation, motor grossly intact Skin: Skin is warm. + scaly white rash to right occipital under hair, noother new lesions, no LE edema Psychiatric: Pt behavior is normal without agitation  All otherwise neg per pt Lab Results  Component Value Date   WBC 6.1 04/03/2020   HGB 14.8 04/03/2020   HCT 43.4 04/03/2020   PLT 198.0 04/03/2020   GLUCOSE 160 (H) 09/28/2019   CHOL 129 04/03/2020   TRIG 42.0 04/03/2020   HDL 56.90 04/03/2020   LDLCALC 64 04/03/2020   ALT 27 04/03/2020   AST 27 04/03/2020   NA 140 09/28/2019   K 3.9 09/28/2019   CL 105 09/28/2019   CREATININE 0.90 09/28/2019   BUN 16 09/28/2019   CO2 27 09/28/2019   TSH 1.13 04/03/2020   PSA 6.93 (H) 04/03/2020   HGBA1C 5.8 04/03/2020         Assessment & Plan:

## 2020-04-19 NOTE — Patient Instructions (Signed)
You had the steroid shot today  Please take all new medication as prescribed - the anti-inflammatory for pain as needed, as well as the gabapentin at bedtime as needed for sciatica pain  Please have your covid testing to be sure about your upper respiratory symptoms  Please restart the nasacort for allergies daily during this time of the year  Please take all new medication as prescribed - the scalp medication ( will have to look that up)  Please continue all other medications as before, and refills have been done if requested.  Please have the pharmacy call with any other refills you may need.  Please continue your efforts at being more active, low cholesterol diet, and weight control.  You are otherwise up to date with prevention measures today.  Please keep your appointments with your specialists as you may have planned  Please make an Appointment to return for your 1 year visit, or sooner if needed, with Lab testing by Appointment as well, to be done about 3-5 days before at the [facility] (so this is for TWO appointments - please see the scheduling desk as you leave)

## 2020-04-20 ENCOUNTER — Encounter: Payer: Self-pay | Admitting: Internal Medicine

## 2020-04-20 MED ORDER — FLUOCINOLONE ACETONIDE BODY 0.01 % EX OIL: TOPICAL_OIL | CUTANEOUS | 0 refills | Status: DC

## 2020-04-20 NOTE — Assessment & Plan Note (Signed)
New onset, exam benign no neuro changes, for gabapentin 300-600 qhs prn, nsaid prn,  to f/u any worsening symptoms or concerns

## 2020-04-20 NOTE — Assessment & Plan Note (Signed)
Mild to mod, for depomedrol IM 80 mg, add nasacort asd, to f/u any worsening symptoms or concerns

## 2020-04-20 NOTE — Assessment & Plan Note (Signed)
stable overall by history and exam, recent data reviewed with pt, and pt to continue medical treatment as before,  to f/u any worsening symptoms or concerns  

## 2020-04-20 NOTE — Assessment & Plan Note (Addendum)
For fluocinolone oil asd,  to f/u any worsening symptoms or concerns  I spent 31 minutes in preparing to see the patient by review of recent labs, imaging and procedures, obtaining and reviewing separately obtained history, communicating with the patient and family or caregiver, ordering medications, tests or procedures, and documenting clinical information in the EHR including the differential Dx, treatment, and any further evaluation and other management of rash, allergies, htn, hyperglycemia, right sciatica

## 2020-04-30 ENCOUNTER — Ambulatory Visit: Payer: No Typology Code available for payment source | Admitting: Family

## 2020-04-30 ENCOUNTER — Other Ambulatory Visit: Payer: Self-pay

## 2020-04-30 ENCOUNTER — Encounter: Payer: Self-pay | Admitting: Family

## 2020-04-30 VITALS — BP 118/76 | HR 76 | Temp 97.8°F | Ht 70.0 in | Wt 184.0 lb

## 2020-04-30 DIAGNOSIS — R590 Localized enlarged lymph nodes: Secondary | ICD-10-CM | POA: Diagnosis not present

## 2020-04-30 MED ORDER — AMOXICILLIN-POT CLAVULANATE 875-125 MG PO TABS: 1.0000 | ORAL_TABLET | Freq: Two times a day (BID) | ORAL | 0 refills | Status: AC

## 2020-04-30 NOTE — Progress Notes (Signed)
Peter Leblanc is a 64 y.o. male with the following history as recorded in [employer]:  Patient Active Problem List   Diagnosis Date Noted  . Rash 04/19/2020  . Sciatica of right side 04/19/2020  . Hyperglycemia 02/02/2019  . Skin lesion 02/02/2019  . Acute respiratory infection 01/25/2019  . Depression 12/21/2017  . Thoracic back pain 03/27/2016  . TIA (transient ischemic attack) 12/06/2015  . HTN (hypertension) 12/06/2015  . Dyspepsia 12/06/2015  . BPH (benign prostatic hypertrophy) with urinary obstruction 11/16/2012  . Anxiety 08/23/2011  . Preventative health care 12/28/2010  . ERECTILE DYSFUNCTION 09/26/2008  . HLD (hyperlipidemia) 11/08/2007  . PSA, INCREASED 11/08/2007  . Allergic rhinitis 02/15/2007    Current Outpatient Medications  Medication Sig Dispense Refill  . aspirin EC 81 MG tablet Take 81 mg by mouth daily.    [name] atorvastatin (LIPITOR) 20 MG tablet TAKE 1 TABLET BY MOUTH EVERY DAY 90 tablet 3  . CIALIS 5 MG tablet Take 1 tablet (5 mg total) by mouth daily. 10 tablet 6  . Fluocinolone Acetonide Body (DERMA-SMOOTHE/FS BODY) 0.01 % OIL Use as directed once daily as needed to affected area 118.28 mL 0  . gabapentin (NEURONTIN) 300 MG capsule 1-2 tab by mouth at bedtime as needed 180 capsule 1  . levocetirizine (XYZAL) 5 MG tablet TAKE 1 TABLET BY MOUTH EVERY DAY IN THE EVENING 90 tablet 3  . losartan (COZAAR) 50 MG tablet Take 1 tablet (50 mg total) by mouth daily. 90 tablet 3  . meloxicam (MOBIC) 15 MG tablet 1 tab by mouth once daily as needed 90 tablet 3  . tamsulosin (FLOMAX) 0.4 MG CAPS capsule TAKE 1 CAPSULE BY MOUTH TWICE A DAY 180 capsule 3  . triamcinolone (NASACORT) 55 MCG/ACT AERO nasal inhaler Place 2 sprays into the nose daily. 3 Inhaler 3  . TURMERIC PO Take by mouth.    . Vitamin D, Ergocalciferol, (DRISDOL) 1.25 MG (50000 UNIT) CAPS capsule Take 1 capsule (50,000 Units total) by mouth every 7 (seven) days. 12 capsule 0  . amoxicillin-clavulanate  (AUGMENTIN) 875-125 MG tablet Take 1 tablet by mouth 2 (two) times daily for 10 days. 20 tablet 0   No current facility-administered medications for this visit.    Allergies: Patient has no known allergies.  Past Medical History:  Diagnosis Date  . ALLERGIC RHINITIS 02/15/2007  . Allergy   . Anxiety 08/23/2011  . BPH (benign prostatic hypertrophy) with urinary obstruction 11/16/2012  . CONJUNCTIVITIS, INFECTIOUS 09/04/2008  . ERECTILE DYSFUNCTION 09/26/2008  . Genital herpes   . HTN (hypertension) 12/06/2015  . HYPERLIPIDEMIA 11/08/2007  . KNEE PAIN, BILATERAL 11/29/2009  . PSA, INCREASED 11/08/2007  . SKIN LESION 11/29/2009  . Slowing of urinary stream 09/04/2008  . TIA (transient ischemic attack) 12/06/2015  . VULVAR ABSCESS 11/27/2009    Past Surgical History:  Procedure Laterality Date  . COLONOSCOPY  12/02/2006   w/B[facility]ie   . GANGLION CYST EXCISION    . NASAL SINUS SURGERY  1997  . PROSTATE BIOPSY    . TONSILLECTOMY    . VASECTOMY      Family History  Adopted: Yes  Family history unknown: Yes    Social History   Tobacco Use  . Smoking status: Never Smoker  . Smokeless tobacco: Never Used  Substance Use Topics  . Alcohol use: Yes    Alcohol/week: 14.0 standard drinks    Types: 14 Cans of beer per week    Subjective:  Sore throat/ swollen glands; has  had negative COVID test; was here about a month ago with similar symptoms and treated for allergies; concerned about persisting symptoms; feels that glands are more noticeable in the evening; increased fatigue; feels that appetite is down due to pain and last some weight; no night sweats;  Labs done at last OV were normal and symptoms were present when those labs were done;   Objective:  Vitals:   04/30/20 0947  BP: 118/76  Pulse: 76  Temp: 97.8 F (36.6 C)  TempSrc: Oral  SpO2: 93%  Weight: 184 lb (83.5 kg)  Height: 5\' 10"  (1.778 m)    General: Well developed, well nourished, in no acute distress  Head: Normocephalic  and atraumatic  Eyes: Sclera and conjunctiva clear; pupils round and reactive to light; extraocular movements intact  Ears: External normal; canals clear; tympanic membranes normal  Oropharynx: Pink, supple. Erythema noted in posterior pharynx Neck: Supple without thyromegaly, + bilateral cervical adenopathy  Lungs: Respirations unlabored; clear to auscultation bilaterally without wheeze, rales, rhonchi  Neurologic: Alert and oriented; speech intact; face symmetrical; moves all extremities well; CNII-XII intact without focal deficit   Assessment:  1. Cervical lymphadenopathy     Plan:  Reviewed labs done at last OV; CBC at that time was normal; will update neck CT; Rx for Augmentin 875 mg bid x 10 days; follow up to be determined;  This visit occurred during the SARS-CoV-2 public health emergency.  Safety protocols were in place, including screening questions prior to the visit, additional usage of staff PPE, and extensive cleaning of exam room while observing appropriate contact time as indicated for disinfecting solutions.     No follow-ups on file.  Orders Placed This Encounter  Procedures  . CT Soft Tissue Neck W Contrast    [facility]  Wt184/no needs/no to all covid q's/wear mask/come alone/ NO diabetic/NO kid dz or liver dz/NO hbp/nkda to iv dye or contrast/no spinal cord stim/no body injector/no glucose mon/ab w/ptNPO 4HRS PRIOR/LIQUID SONLY PRIOR    Standing Status:   Future    Standing Expiration Date:   04/30/2021    Order Specific Question:   If indicated for the ordered procedure, I authorize the administration of contrast media per Radiology protocol    Answer:   Yes    Order Specific Question:   Preferred imaging location?    Answer:   [id number]    Requested Prescriptions   Signed Prescriptions Disp Refills  . amoxicillin-clavulanate (AUGMENTIN) 875-125 MG tablet 20 tablet 0    Sig: Take 1 tablet by mouth 2 (two) times daily for 10 days.

## 2020-05-20 ENCOUNTER — Ambulatory Visit
Admission: RE | Admit: 2020-05-20 | Discharge: 2020-05-20 | Disposition: A | Discharge status: Home / Self Care | Payer: No Typology Code available for payment source | Source: Ambulatory Visit | Attending: Family | Admitting: Family

## 2020-05-20 DIAGNOSIS — R590 Localized enlarged lymph nodes: Secondary | ICD-10-CM

## 2020-05-20 MED ORDER — IOPAMIDOL (ISOVUE-370) INJECTION 76%
75.0000 mL | Freq: Once | INTRAVENOUS | Status: AC | PRN
  Administered 2020-05-20: 75 mL via INTRAVENOUS

## 2020-05-23 ENCOUNTER — Telehealth: Payer: Self-pay | Admitting: Internal Medicine

## 2020-05-23 DIAGNOSIS — R590 Localized enlarged lymph nodes: Secondary | ICD-10-CM

## 2020-05-23 NOTE — Telephone Encounter (Signed)
Patient was seen by Mickel Baas on 11.09.21 and she mentioned getting a referral to ENT if the patient wanted it and the patient is calling back to see if he could get that.  [phone]

## 2020-05-24 NOTE — Telephone Encounter (Signed)
Sent to Dr. John. °

## 2020-05-24 NOTE — Telephone Encounter (Signed)
Ok to send to provider involved since I was not part of this discussion

## 2020-05-27 NOTE — Telephone Encounter (Signed)
Sent to [facility].

## 2020-05-27 NOTE — Telephone Encounter (Signed)
Informed patient that a referral has been placed as requested to ENT.

## 2020-05-27 NOTE — Addendum Note (Signed)
Addended by: Sherlene Shams on: 05/27/2020 11:58 AM   Modules accepted: Orders

## 2020-05-27 NOTE — Telephone Encounter (Signed)
We will do the referral as requested to ENT.

## 2020-06-11 DIAGNOSIS — M2669 Other specified disorders of temporomandibular joint: Secondary | ICD-10-CM | POA: Insufficient documentation

## 2020-06-11 DIAGNOSIS — K219 Gastro-esophageal reflux disease without esophagitis: Secondary | ICD-10-CM | POA: Insufficient documentation

## 2020-07-11 ENCOUNTER — Other Ambulatory Visit: Payer: Self-pay | Admitting: Internal Medicine

## 2020-07-20 ENCOUNTER — Other Ambulatory Visit: Payer: Self-pay | Admitting: Internal Medicine

## 2020-07-20 NOTE — Telephone Encounter (Signed)
Please refill as per office routine med refill policy (all routine meds refilled for 3 mo or monthly per pt preference up to one year from last visit, then month to month grace period for 3 mo, then further med refills will have to be denied)  

## 2020-08-27 ENCOUNTER — Other Ambulatory Visit: Payer: Self-pay | Admitting: Internal Medicine

## 2020-11-11 ENCOUNTER — Other Ambulatory Visit: Payer: Self-pay | Admitting: Internal Medicine

## 2020-11-12 NOTE — Telephone Encounter (Signed)
Please refill as per office routine med refill policy (all routine meds refilled for 3 mo or monthly per pt preference up to one year from last visit, then month to month grace period for 3 mo, then further med refills will have to be denied)  

## 2020-11-25 ENCOUNTER — Other Ambulatory Visit: Payer: Self-pay | Admitting: Internal Medicine

## 2020-11-25 NOTE — Telephone Encounter (Signed)
Please refill as per office routine med refill policy (all routine meds refilled for 3 mo or monthly per pt preference up to one year from last visit, then month to month grace period for 3 mo, then further med refills will have to be denied)  

## 2020-11-27 ENCOUNTER — Other Ambulatory Visit: Payer: Self-pay | Admitting: Internal Medicine

## 2020-11-27 MED ORDER — ATORVASTATIN CALCIUM 20 MG PO TABS
1.0000 | ORAL_TABLET | Freq: Every day | ORAL | 1 refills | Status: DC
Start: 2020-11-27 — End: 2021-05-02

## 2020-12-04 ENCOUNTER — Ambulatory Visit (INDEPENDENT_AMBULATORY_CARE_PROVIDER_SITE_OTHER): Payer: No Typology Code available for payment source

## 2020-12-04 ENCOUNTER — Ambulatory Visit: Payer: No Typology Code available for payment source | Admitting: Internal Medicine

## 2020-12-04 ENCOUNTER — Other Ambulatory Visit: Payer: Self-pay

## 2020-12-04 ENCOUNTER — Encounter: Payer: Self-pay | Admitting: Internal Medicine

## 2020-12-04 VITALS — BP 124/82 | HR 87 | Temp 98.1°F | Ht 70.0 in | Wt 181.0 lb

## 2020-12-04 DIAGNOSIS — F32A Depression, unspecified: Secondary | ICD-10-CM

## 2020-12-04 DIAGNOSIS — I452 Bifascicular block: Secondary | ICD-10-CM

## 2020-12-04 DIAGNOSIS — E785 Hyperlipidemia, unspecified: Secondary | ICD-10-CM

## 2020-12-04 DIAGNOSIS — R002 Palpitations: Secondary | ICD-10-CM | POA: Diagnosis not present

## 2020-12-04 DIAGNOSIS — R739 Hyperglycemia, unspecified: Secondary | ICD-10-CM | POA: Diagnosis not present

## 2020-12-04 NOTE — Progress Notes (Signed)
Patient ID: Peter Leblanc, male   DOB: 04-14-1956, 65 y.o.   MRN: [id number]        Chief Complaint: unusual exhaustion and fatigue, recurring palpitations, and hld       HPI:  Peter Leblanc is a 65 y.o. male here with 3 wks onset unusual fatigue and exhaustion with doing his daily routine, though has not had to stop or miss work; just seems overall weak in the legs especially, has occasional nausea mild intermittent but Denies worsening reflux, abd pain, dysphagia, vomiting, bowel change or blood.  Also mentions an unusual symptoms of smelling gas sensation when he knows there is none around him.  COVID neg at home yesterday,  Pt denies fever, night sweats, loss of appetite, or other constitutional symptom, though has lost several lbs in past 6 mo for unclear reasons.  Has been keeping up with fluids. Pt denies chest pain, increased sob or doe, wheezing, orthopnea, PND, increased LE swelling, dizziness or syncope, though does also describe mild intermittent episodes of discrete start/stop rapid palpitations very other day or so, new for him.  Trying to follow lower chol diet.  Also with ongoing stressors at work midly worsening recently, but Denies worsening depressive symptoms, suicidal ideation, or panic.  Overall good med compliance.          Wt Readings from Last 3 Encounters:  12/04/20 181 lb (82.1 kg)  04/30/20 184 lb (83.5 kg)  04/19/20 186 lb (84.4 kg)   BP Readings from Last 3 Encounters:  12/04/20 124/82  04/30/20 118/76  04/19/20 118/80         Past Medical History:  Diagnosis Date   ALLERGIC RHINITIS 02/15/2007   Allergy    Anxiety 08/23/2011   BPH (benign prostatic hypertrophy) with urinary obstruction 11/16/2012   CONJUNCTIVITIS, INFECTIOUS 09/04/2008   ERECTILE DYSFUNCTION 09/26/2008   Genital herpes    HTN (hypertension) 12/06/2015   HYPERLIPIDEMIA 11/08/2007   KNEE PAIN, BILATERAL 11/29/2009   PSA, INCREASED 11/08/2007   SKIN LESION 11/29/2009   Slowing of urinary stream 09/04/2008    TIA (transient ischemic attack) 12/06/2015   VULVAR ABSCESS 11/27/2009   Past Surgical History:  Procedure Laterality Date   COLONOSCOPY  12/02/2006   w/Brodie    GANGLION CYST EXCISION     NASAL SINUS SURGERY  1997   PROSTATE BIOPSY     TONSILLECTOMY     VASECTOMY      reports that he has never smoked. He has never used smokeless tobacco. He reports current alcohol use of about 14.0 standard drinks of alcohol per week. He reports that he does not use drugs. He was adopted. Family history is unknown by patient. No Known Allergies Current Outpatient Medications on File Prior to Visit  Medication Sig Dispense Refill   aspirin EC 81 MG tablet Take 81 mg by mouth daily.     atorvastatin (LIPITOR) 20 MG tablet Take 1 tablet (20 mg total) by mouth daily. 90 tablet 1   CIALIS 5 MG tablet Take 1 tablet (5 mg total) by mouth daily. 10 tablet 6   Fluocinolone Acetonide Body 0.01 % OIL USE AS DIRECTED ONCE DAILY AS NEEDED TO AFFECTED AREA 118.28 mL 0   gabapentin (NEURONTIN) 300 MG capsule 1-2 tab by mouth at bedtime as needed 180 capsule 1   levocetirizine (XYZAL) 5 MG tablet TAKE 1 TABLET BY MOUTH EVERY DAY IN THE EVENING 90 tablet 1   losartan (COZAAR) 50 MG tablet TAKE 1 TABLET BY  MOUTH EVERY DAY 90 tablet 3   meloxicam (MOBIC) 15 MG tablet 1 tab by mouth once daily as needed 90 tablet 3   tamsulosin (FLOMAX) 0.4 MG CAPS capsule TAKE 1 CAPSULE BY MOUTH TWICE A DAY 180 capsule 3   triamcinolone (NASACORT) 55 MCG/ACT AERO nasal inhaler Place 2 sprays into the nose daily. 3 Inhaler 3   TURMERIC PO Take by mouth.     DENTA 5000 PLUS 1.1 % CREA dental cream SMARTSIG:Sparingly By Mouth Every Night     No current facility-administered medications on file prior to visit.        ROS:  All others reviewed and negative.  Objective        PE:  BP 124/82 (BP Location: Left Arm, Patient Position: Sitting, Cuff Size: Normal)   Pulse 87   Temp 98.1 F (36.7 C) (Oral)   Ht 5\' 10"  (1.778 m)   Wt 181  lb (82.1 kg)   SpO2 96%   BMI 25.97 kg/m                 Constitutional: Pt appears in NAD               HENT: Head: NCAT.                Right Ear: External ear normal.                 Left Ear: External ear normal.                Eyes: . Pupils are equal, round, and reactive to light. Conjunctivae and EOM are normal               Nose: without d/c or deformity               Neck: Neck supple. Gross normal ROM               Cardiovascular: Normal rate and regular rhythm.                 Pulmonary/Chest: Effort normal and breath sounds without rales or wheezing.                Abd:  Soft, NT, ND, + BS, no organomegaly               Neurological: Pt is alert. At baseline orientation, motor grossly intact               Skin: Skin is warm. No rashes, no other new lesions, LE edema - none               Psychiatric: Pt behavior is normal without agitation   Micro: none  Cardiac tracings I have personally interpreted today:  ECG - NSR with bifascular block  Pertinent Radiological findings (summarize): none   Lab Results  Component Value Date   WBC 8.0 12/04/2020   HGB 14.7 12/04/2020   HCT 42.2 12/04/2020   PLT 229.0 12/04/2020   GLUCOSE 83 12/04/2020   CHOL 166 12/04/2020   TRIG 314.0 (H) 12/04/2020   HDL 60.90 12/04/2020   LDLDIRECT 86.0 12/04/2020   LDLCALC 64 04/03/2020   ALT 18 12/04/2020   AST 23 12/04/2020   NA 139 12/04/2020   K 4.7 12/04/2020   CL 103 12/04/2020   CREATININE 0.92 12/04/2020   BUN 14 12/04/2020   CO2 29 12/04/2020   TSH 1.09 12/04/2020   PSA 6.93 (H) 04/03/2020  HGBA1C 5.8 04/03/2020   Assessment/Plan:  Peter Leblanc is a 65 y.o. White or Caucasian [1] male with  has a past medical history of ALLERGIC RHINITIS (02/15/2007), Allergy, Anxiety (08/23/2011), BPH (benign prostatic hypertrophy) with urinary obstruction (11/16/2012), CONJUNCTIVITIS, INFECTIOUS (09/04/2008), ERECTILE DYSFUNCTION (09/26/2008), Genital herpes, HTN (hypertension) (12/06/2015),  HYPERLIPIDEMIA (11/08/2007), KNEE PAIN, BILATERAL (11/29/2009), PSA, INCREASED (11/08/2007), SKIN LESION (11/29/2009), Slowing of urinary stream (09/04/2008), TIA (transient ischemic attack) (12/06/2015), and VULVAR ABSCESS (11/27/2009).  Bifascicular block New from 2017, denies CP per se but now with unusual symptoms cant r/o cardiac, pt is not currently on neg chronotropes, will have cardiac event monitor and refer cardiology  HLD (hyperlipidemia) Lab Results  Component Value Date   LDLCALC 64 04/03/2020   Stable, pt to continue current statin liptior 20  Hyperglycemia Lab Results  Component Value Date   HGBA1C 5.8 04/03/2020   Stable, pt to continue current medical treatment * - diet   Rapid palpitations I suspect signifiicant arrythmia such as SVT or PAF; for cardiac event monitor refer cardiology, cont asa 81 qd  Depression Possibly mild worsening related to work stressors, this could be an element of his fatigue and exhaustion but i doubt accounts for the majority of his symptoms, suspect more physical cause, for cxr, and labs including cbc tsh  Followup: Return if symptoms worsen or fail to improve.  Cathlean Cower, MD 12/08/2020 9:10 AM [facility]

## 2020-12-04 NOTE — Patient Instructions (Addendum)
Your EKG was ok today  - negative for heart attack  Please continue all other medications as before, and refills have been done if requested.  Please have the pharmacy call with any other refills you may need.  Please continue your efforts at being more active, low cholesterol diet, and weight control.  You are otherwise up to date with prevention measures today.  Please keep your appointments with your specialists as you may have planned  You will be contacted regarding the referral for: Cardiology, and the Cardiac Event monitor  Please go to the XRAY Department in the first floor for the x-ray testing  Please go to the LAB at the blood drawing area for the tests to be done  You will be contacted by phone if any changes need to be made immediately.  Otherwise, you will receive a letter about your results with an explanation, but please check with MyChart first.  Please remember to sign up for MyChart if you have not done so, as this will be important to you in the future with finding out test results, communicating by private email, and scheduling acute appointments online when needed.  You are given the work note today   .

## 2020-12-05 ENCOUNTER — Encounter: Payer: Self-pay | Admitting: Internal Medicine

## 2020-12-05 LAB — CBC WITH DIFFERENTIAL/PLATELET
Basophils Absolute: 0.1 10*3/uL (ref 0.0–0.1)
Basophils Relative: 1.3 % (ref 0.0–3.0)
Eosinophils Absolute: 0.2 10*3/uL (ref 0.0–0.7)
Eosinophils Relative: 2.6 % (ref 0.0–5.0)
HCT: 42.2 % (ref 39.0–52.0)
Hemoglobin: 14.7 g/dL (ref 13.0–17.0)
Lymphocytes Relative: 29.6 % (ref 12.0–46.0)
Lymphs Abs: 2.4 10*3/uL (ref 0.7–4.0)
MCHC: 34.8 g/dL (ref 30.0–36.0)
MCV: 97.2 fl (ref 78.0–100.0)
Monocytes Absolute: 0.7 10*3/uL (ref 0.1–1.0)
Monocytes Relative: 9.3 % (ref 3.0–12.0)
Neutro Abs: 4.6 10*3/uL (ref 1.4–7.7)
Neutrophils Relative %: 57.2 % (ref 43.0–77.0)
Platelets: 229 10*3/uL (ref 150.0–400.0)
RBC: 4.35 Mil/uL (ref 4.22–5.81)
RDW: 12.6 % (ref 11.5–15.5)
WBC: 8 10*3/uL (ref 4.0–10.5)

## 2020-12-05 LAB — LIPID PANEL
Cholesterol: 166 mg/dL (ref 0–200)
HDL: 60.9 mg/dL (ref >39.00)
NonHDL: 105.38
Total CHOL/HDL Ratio: 3
Triglycerides: 314 mg/dL — ABNORMAL HIGH (ref 0.0–149.0)
VLDL: 62.8 mg/dL — ABNORMAL HIGH (ref 0.0–40.0)

## 2020-12-05 LAB — URINALYSIS, ROUTINE W REFLEX MICROSCOPIC
Bilirubin Urine: NEGATIVE
Hgb urine dipstick: NEGATIVE
Ketones, ur: NEGATIVE
Leukocytes,Ua: NEGATIVE
Nitrite: NEGATIVE
RBC / HPF: NONE SEEN (ref 0–?)
Specific Gravity, Urine: 1.01 (ref 1.000–1.030)
Total Protein, Urine: NEGATIVE
Urine Glucose: NEGATIVE
Urobilinogen, UA: 0.2 (ref 0.0–1.0)
WBC, UA: NONE SEEN (ref 0–?)
pH: 8 (ref 5.0–8.0)

## 2020-12-05 LAB — HEPATIC FUNCTION PANEL
ALT: 18 U/L (ref 0–53)
AST: 23 U/L (ref 0–37)
Albumin: 4.5 g/dL (ref 3.5–5.2)
Alkaline Phosphatase: 45 U/L (ref 39–117)
Bilirubin, Direct: 0.1 mg/dL (ref 0.0–0.3)
Total Bilirubin: 0.7 mg/dL (ref 0.2–1.2)
Total Protein: 6.9 g/dL (ref 6.0–8.3)

## 2020-12-05 LAB — BASIC METABOLIC PANEL
BUN: 14 mg/dL (ref 6–23)
CO2: 29 mEq/L (ref 19–32)
Calcium: 9.4 mg/dL (ref 8.4–10.5)
Chloride: 103 mEq/L (ref 96–112)
Creatinine, Ser: 0.92 mg/dL (ref 0.40–1.50)
GFR: 88.11 mL/min (ref >60.00)
Glucose, Bld: 83 mg/dL (ref 70–99)
Potassium: 4.7 mEq/L (ref 3.5–5.1)
Sodium: 139 mEq/L (ref 135–145)

## 2020-12-05 LAB — MAGNESIUM: Magnesium: 2.2 mg/dL (ref 1.5–2.5)

## 2020-12-05 LAB — LDL CHOLESTEROL, DIRECT: Direct LDL: 86 mg/dL

## 2020-12-05 LAB — TSH: TSH: 1.09 u[IU]/mL (ref 0.35–4.50)

## 2020-12-08 ENCOUNTER — Encounter: Payer: Self-pay | Admitting: Internal Medicine

## 2020-12-08 DIAGNOSIS — I452 Bifascicular block: Secondary | ICD-10-CM | POA: Insufficient documentation

## 2020-12-08 NOTE — Assessment & Plan Note (Signed)
New from 2017, denies CP per se but now with unusual symptoms cant r/o cardiac, pt is not currently on neg chronotropes, will have cardiac event monitor and refer cardiology

## 2020-12-08 NOTE — Assessment & Plan Note (Addendum)
Possibly mild worsening related to work stressors, this could be an element of his fatigue and exhaustion but i doubt accounts for the majority of his symptoms, suspect more physical cause, for cxr, and labs including cbc tsh

## 2020-12-08 NOTE — Assessment & Plan Note (Signed)
Lab Results  Component Value Date   LDLCALC 64 04/03/2020   Stable, pt to continue current statin liptior 20

## 2020-12-08 NOTE — Assessment & Plan Note (Signed)
I suspect signifiicant arrythmia such as SVT or PAF; for cardiac event monitor refer cardiology, cont asa 81 qd

## 2020-12-08 NOTE — Assessment & Plan Note (Signed)
Lab Results  Component Value Date   HGBA1C 5.8 04/03/2020   Stable, pt to continue current medical treatment * - diet

## 2021-02-20 ENCOUNTER — Encounter: Payer: Self-pay | Admitting: Cardiovascular Disease

## 2021-02-20 NOTE — Progress Notes (Signed)
Cardiology Office Note:    Date:  02/21/2021   ID:  Peter Leblanc, DOB 10-17-1955, MRN [id number]  PCP:  Peter Borg, MD   [facility] Providers Cardiologist:  Kathrynn Humble to update primary MD,subspecialty MD or APP then REFRESH:1}    Referring MD: Peter Borg, MD   Chief Complaint  Patient presents with   Palpitations    Sept. 2, 20222    Peter Leblanc is a 65 y.o. male with a hx of palpitations. We were asked to see him by Dr. Jenny Leblanc for further evaluation of his palpitations.  6-8 months of heart racing Last for 5-10 min Feels weak initially and notices that his heart rate is faster than usual. Counts a HR of 90s Typically occurs late in the day.  Is not associated with eating or drinking. Works as a Physiological scientist, walks quite a bit  Does not exercise  Is busy, but no real cardio   Past Medical History:  Diagnosis Date   ALLERGIC RHINITIS 02/15/2007   Allergy    Anxiety 08/23/2011   BPH (benign prostatic hypertrophy) with urinary obstruction 11/16/2012   CONJUNCTIVITIS, INFECTIOUS 09/04/2008   ERECTILE DYSFUNCTION 09/26/2008   Genital herpes    HTN (hypertension) 12/06/2015   HYPERLIPIDEMIA 11/08/2007   KNEE PAIN, BILATERAL 11/29/2009   PSA, INCREASED 11/08/2007   SKIN LESION 11/29/2009   Slowing of urinary stream 09/04/2008   TIA (transient ischemic attack) 12/06/2015   VULVAR ABSCESS 11/27/2009    Past Surgical History:  Procedure Laterality Date   COLONOSCOPY  12/02/2006   w/Brodie    GANGLION CYST EXCISION     NASAL SINUS SURGERY  1997   PROSTATE BIOPSY     TONSILLECTOMY     VASECTOMY      Current Medications: Current Meds  Medication Sig   atorvastatin (LIPITOR) 20 MG tablet Take 1 tablet (20 mg total) by mouth daily.   CIALIS 5 MG tablet Take 1 tablet (5 mg total) by mouth daily.   DENTA 5000 PLUS 1.1 % CREA dental cream SMARTSIG:Sparingly By Mouth Every Night   Fluocinolone Acetonide Body 0.01 % OIL USE AS DIRECTED ONCE DAILY AS NEEDED TO  AFFECTED AREA   losartan (COZAAR) 50 MG tablet TAKE 1 TABLET BY MOUTH EVERY DAY     Allergies:   Patient has no known allergies.   Social History   Socioeconomic History   Marital status: Married    Spouse name: Not on file   Number of children: Not on file   Years of education: Not on file   Highest education level: Not on file  Occupational History   Not on file  Tobacco Use   Smoking status: Never   Smokeless tobacco: Never  Vaping Use   Vaping Use: Never used  Substance and Sexual Activity   Alcohol use: Yes    Alcohol/week: 14.0 standard drinks    Types: 14 Cans of beer per week   Drug use: No   Sexual activity: Not on file  Other Topics Concern   Not on file  Social History Narrative   Not on file   Social Determinants of Health   Financial Resource Strain: Not on file  Food Insecurity: Not on file  Transportation Needs: Not on file  Physical Activity: Not on file  Stress: Not on file  Social Connections: Not on file     Family History: The patient's He was adopted. Family history is unknown by patient.  ROS:  Please see the history of present illness.     All other systems reviewed and are negative.  EKGs/Labs/Other Studies Reviewed:    The following studies were reviewed today:   EKG:     Recent Labs: 12/04/2020: ALT 18; BUN 14; Creatinine, Ser 0.92; Hemoglobin 14.7; Magnesium 2.2; Platelets 229.0; Potassium 4.7; Sodium 139; TSH 1.09  Recent Lipid Panel    Component Value Date/Time   CHOL 166 12/04/2020 1638   TRIG 314.0 (H) 12/04/2020 1638   HDL 60.90 12/04/2020 1638   CHOLHDL 3 12/04/2020 1638   VLDL 62.8 (H) 12/04/2020 1638   LDLCALC 64 04/03/2020 1102   LDLDIRECT 86.0 12/04/2020 1638     Risk Assessment/Calculations:           Physical Exam:    VS:  BP 122/78   Pulse 83   Ht '5\' 10"'$  (1.778 m)   Wt 180 lb 6.4 oz (81.8 kg)   SpO2 99%   BMI 25.88 kg/m     Wt Readings from Last 3 Encounters:  02/21/21 180 lb 6.4 oz (81.8  kg)  12/04/20 181 lb (82.1 kg)  04/30/20 184 lb (83.5 kg)     GEN:  Well nourished, well developed in no acute distress HEENT: Normal NECK: No JVD; No carotid bruits LYMPHATICS: No lymphadenopathy CARDIAC: RRR, no murmurs, rubs, gallops RESPIRATORY:  Clear to auscultation without rales, wheezing or rhonchi  ABDOMEN: Soft, non-tender, non-distended MUSCULOSKELETAL:  No edema; No deformity  SKIN: Warm and dry NEUROLOGIC:  Alert and oriented x 3 PSYCHIATRIC:  Normal affect   ASSESSMENT:    1. Primary hypertension   2. Palpitations    PLAN:      Palpitations: After reviewing his symptoms and his dietary history.  I suspect that he is volume depleted.  I encouraged him to drink more water.  I encouraged him to also add some electrolytes to his water or have a V8 juice in the morning and perhaps in the afternoon.  He will need to monitor his blood pressure throughout all this.  I have also encouraged him to start getting some regular cardio exercise several times a week.  I suspect that his symptoms will resolve with better hydration.  If he continues to have symptoms of palpitations and we will have him return in consider getting a monitor and possibly an echocardiogram.  He will follow-up as needed.        Medication Adjustments/Labs and Tests Ordered: Current medicines are reviewed at length with the patient today.  Concerns regarding medicines are outlined above.  No orders of the defined types were placed in this encounter.  No orders of the defined types were placed in this encounter.   Patient Instructions  Better hydration V-8 Liquid IV  Nuun tablets  More protein ( grilled chicken)  More water Less soda  Cardio exercise 3-4 times per week.   Medication Instructions:  Your physician recommends that you continue on your current medications as directed. Please refer to the Current Medication list given to you today.  *If you need a refill on your cardiac  medications before your next appointment, please call your pharmacy*   Follow-Up: At [facility], you and your health needs are our priority.  As part of our continuing mission to provide you with exceptional heart care, we have created designated Provider Care Teams.  These Care Teams include your primary Cardiologist (physician) and Advanced Practice Providers (APPs -  Physician Assistants and Nurse Practitioners) who all work together  to provide you with the care you need, when you need it.  Follow up with Dr. Acie Fredrickson as needed.     Signed, Mertie Moores, MD  02/21/2021 8:47 AM    [facility]

## 2021-02-21 ENCOUNTER — Other Ambulatory Visit: Payer: Self-pay

## 2021-02-21 ENCOUNTER — Encounter: Payer: Self-pay | Admitting: Cardiovascular Disease

## 2021-02-21 ENCOUNTER — Ambulatory Visit (INDEPENDENT_AMBULATORY_CARE_PROVIDER_SITE_OTHER): Payer: No Typology Code available for payment source | Admitting: Cardiovascular Disease

## 2021-02-21 VITALS — BP 122/78 | HR 83 | Ht 70.0 in | Wt 180.4 lb

## 2021-02-21 DIAGNOSIS — R002 Palpitations: Secondary | ICD-10-CM | POA: Diagnosis not present

## 2021-02-21 DIAGNOSIS — I10 Essential (primary) hypertension: Secondary | ICD-10-CM | POA: Diagnosis not present

## 2021-02-21 NOTE — Patient Instructions (Addendum)
Better hydration V-8 Liquid IV  Nuun tablets  More protein ( grilled chicken)  More water Less soda  Cardio exercise 3-4 times per week.   Medication Instructions:  Your physician recommends that you continue on your current medications as directed. Please refer to the Current Medication list given to you today.  *If you need a refill on your cardiac medications before your next appointment, please call your pharmacy*   Follow-Up: At [facility], you and your health needs are our priority.  As part of our continuing mission to provide you with exceptional heart care, we have created designated Provider Care Teams.  These Care Teams include your primary Cardiologist (physician) and Advanced Practice Providers (APPs -  Physician Assistants and Nurse Practitioners) who all work together to provide you with the care you need, when you need it.  Follow up with Dr. Acie Fredrickson as needed.

## 2021-04-15 ENCOUNTER — Other Ambulatory Visit: Payer: Self-pay | Admitting: Internal Medicine

## 2021-04-16 ENCOUNTER — Telehealth: Payer: Self-pay | Admitting: Internal Medicine

## 2021-04-16 DIAGNOSIS — E538 Deficiency of other specified B group vitamins: Secondary | ICD-10-CM

## 2021-04-16 DIAGNOSIS — R739 Hyperglycemia, unspecified: Secondary | ICD-10-CM

## 2021-04-16 DIAGNOSIS — E559 Vitamin D deficiency, unspecified: Secondary | ICD-10-CM

## 2021-04-16 DIAGNOSIS — Z0000 Encounter for general adult medical examination without abnormal findings: Secondary | ICD-10-CM

## 2021-04-16 DIAGNOSIS — E7800 Pure hypercholesterolemia, unspecified: Secondary | ICD-10-CM

## 2021-04-16 NOTE — Telephone Encounter (Signed)
CPE appt with PCP 11/11 Lab appt 11/7  Please add lab orders

## 2021-04-28 ENCOUNTER — Other Ambulatory Visit (INDEPENDENT_AMBULATORY_CARE_PROVIDER_SITE_OTHER): Payer: No Typology Code available for payment source

## 2021-04-28 ENCOUNTER — Other Ambulatory Visit: Payer: Self-pay

## 2021-04-28 DIAGNOSIS — R739 Hyperglycemia, unspecified: Secondary | ICD-10-CM

## 2021-04-28 DIAGNOSIS — E538 Deficiency of other specified B group vitamins: Secondary | ICD-10-CM

## 2021-04-28 DIAGNOSIS — E7800 Pure hypercholesterolemia, unspecified: Secondary | ICD-10-CM | POA: Diagnosis not present

## 2021-04-28 DIAGNOSIS — E559 Vitamin D deficiency, unspecified: Secondary | ICD-10-CM

## 2021-04-28 DIAGNOSIS — Z0000 Encounter for general adult medical examination without abnormal findings: Secondary | ICD-10-CM | POA: Diagnosis not present

## 2021-04-28 LAB — URINALYSIS, ROUTINE W REFLEX MICROSCOPIC
Bilirubin Urine: NEGATIVE
Hgb urine dipstick: NEGATIVE
Ketones, ur: NEGATIVE
Nitrite: NEGATIVE
Specific Gravity, Urine: 1.015 (ref 1.000–1.030)
Total Protein, Urine: NEGATIVE
Urine Glucose: NEGATIVE
Urobilinogen, UA: 0.2 (ref 0.0–1.0)
pH: 7 (ref 5.0–8.0)

## 2021-04-28 LAB — CBC WITH DIFFERENTIAL/PLATELET
Basophils Absolute: 0 10*3/uL (ref 0.0–0.1)
Basophils Relative: 0.5 % (ref 0.0–3.0)
Eosinophils Absolute: 0.2 10*3/uL (ref 0.0–0.7)
Eosinophils Relative: 3.9 % (ref 0.0–5.0)
HCT: 41.1 % (ref 39.0–52.0)
Hemoglobin: 14.1 g/dL (ref 13.0–17.0)
Lymphocytes Relative: 32.2 % (ref 12.0–46.0)
Lymphs Abs: 1.9 10*3/uL (ref 0.7–4.0)
MCHC: 34.2 g/dL (ref 30.0–36.0)
MCV: 96.4 fl (ref 78.0–100.0)
Monocytes Absolute: 0.6 10*3/uL (ref 0.1–1.0)
Monocytes Relative: 10.1 % (ref 3.0–12.0)
Neutro Abs: 3.2 10*3/uL (ref 1.4–7.7)
Neutrophils Relative %: 53.3 % (ref 43.0–77.0)
Platelets: 227 10*3/uL (ref 150.0–400.0)
RBC: 4.27 Mil/uL (ref 4.22–5.81)
RDW: 12.6 % (ref 11.5–15.5)
WBC: 6 10*3/uL (ref 4.0–10.5)

## 2021-04-28 LAB — BASIC METABOLIC PANEL
BUN: 15 mg/dL (ref 6–23)
CO2: 28 mEq/L (ref 19–32)
Calcium: 9.2 mg/dL (ref 8.4–10.5)
Chloride: 104 mEq/L (ref 96–112)
Creatinine, Ser: 0.8 mg/dL (ref 0.40–1.50)
GFR: 93.48 mL/min (ref >60.00)
Glucose, Bld: 94 mg/dL (ref 70–99)
Potassium: 4.4 mEq/L (ref 3.5–5.1)
Sodium: 139 mEq/L (ref 135–145)

## 2021-04-28 LAB — LIPID PANEL
Cholesterol: 144 mg/dL (ref 0–200)
HDL: 57.9 mg/dL (ref >39.00)
LDL Cholesterol: 70 mg/dL (ref 0–99)
NonHDL: 86.49
Total CHOL/HDL Ratio: 2
Triglycerides: 81 mg/dL (ref 0.0–149.0)
VLDL: 16.2 mg/dL (ref 0.0–40.0)

## 2021-04-28 LAB — VITAMIN B12: Vitamin B-12: 486 pg/mL (ref 211–911)

## 2021-04-28 LAB — HEPATIC FUNCTION PANEL
ALT: 22 U/L (ref 0–53)
AST: 23 U/L (ref 0–37)
Albumin: 4.3 g/dL (ref 3.5–5.2)
Alkaline Phosphatase: 46 U/L (ref 39–117)
Bilirubin, Direct: 0.2 mg/dL (ref 0.0–0.3)
Total Bilirubin: 0.7 mg/dL (ref 0.2–1.2)
Total Protein: 6.3 g/dL (ref 6.0–8.3)

## 2021-04-28 LAB — TSH: TSH: 1.45 u[IU]/mL (ref 0.35–5.50)

## 2021-04-28 LAB — PSA: PSA: 9.33 ng/mL — ABNORMAL HIGH (ref 0.10–4.00)

## 2021-04-28 LAB — VITAMIN D 25 HYDROXY (VIT D DEFICIENCY, FRACTURES): VITD: 31.84 ng/mL (ref 30.00–100.00)

## 2021-04-28 LAB — HEMOGLOBIN A1C: Hgb A1c MFr Bld: 5.7 % (ref 4.6–6.5)

## 2021-05-02 ENCOUNTER — Encounter: Payer: Self-pay | Admitting: Internal Medicine

## 2021-05-02 ENCOUNTER — Ambulatory Visit (INDEPENDENT_AMBULATORY_CARE_PROVIDER_SITE_OTHER): Payer: No Typology Code available for payment source | Admitting: Internal Medicine

## 2021-05-02 ENCOUNTER — Other Ambulatory Visit: Payer: Self-pay

## 2021-05-02 VITALS — BP 138/80 | HR 80 | Temp 98.2°F | Ht 70.0 in | Wt 181.2 lb

## 2021-05-02 DIAGNOSIS — E559 Vitamin D deficiency, unspecified: Secondary | ICD-10-CM

## 2021-05-02 DIAGNOSIS — M19072 Primary osteoarthritis, left ankle and foot: Secondary | ICD-10-CM | POA: Diagnosis not present

## 2021-05-02 DIAGNOSIS — R739 Hyperglycemia, unspecified: Secondary | ICD-10-CM

## 2021-05-02 DIAGNOSIS — Z0001 Encounter for general adult medical examination with abnormal findings: Secondary | ICD-10-CM

## 2021-05-02 DIAGNOSIS — L989 Disorder of the skin and subcutaneous tissue, unspecified: Secondary | ICD-10-CM

## 2021-05-02 DIAGNOSIS — Z23 Encounter for immunization: Secondary | ICD-10-CM

## 2021-05-02 DIAGNOSIS — I10 Essential (primary) hypertension: Secondary | ICD-10-CM

## 2021-05-02 DIAGNOSIS — E785 Hyperlipidemia, unspecified: Secondary | ICD-10-CM

## 2021-05-02 DIAGNOSIS — G4700 Insomnia, unspecified: Secondary | ICD-10-CM

## 2021-05-02 MED ORDER — LOSARTAN POTASSIUM 50 MG PO TABS: 50.0000 mg | ORAL_TABLET | Freq: Every day | ORAL | 3 refills | Status: DC

## 2021-05-02 MED ORDER — CIALIS 5 MG PO TABS: 5.0000 mg | ORAL_TABLET | Freq: Every day | ORAL | 6 refills | Status: DC

## 2021-05-02 MED ORDER — MELOXICAM 15 MG PO TABS: ORAL_TABLET | ORAL | 3 refills | Status: DC

## 2021-05-02 MED ORDER — ATORVASTATIN CALCIUM 20 MG PO TABS: 20.0000 mg | ORAL_TABLET | Freq: Every day | ORAL | 3 refills | Status: DC

## 2021-05-02 NOTE — Patient Instructions (Addendum)
You had the flu shot today, and the Tdap tetanus shot today  Please watch for the Novavax covid vaccine soon  Please continue all other medications as before, including the mobic for the left foot arthritis  Please return in 2-3 weeks for a NURSE VISIT appt for shingles shot #1  Ok to try OTC melatonin up to 10 mg at bedtime for sleep  Please take OTC Vitamin D3 at 2000 units per day, indefinitely    Please have the pharmacy call with any other refills you may need.  Please continue your efforts at being more active, low cholesterol diet, and weight control.  You are otherwise up to date with prevention measures today.  Please keep your appointments with your specialists as you may have planned - Dr Gilford Rile for urology  We have discussed the Cardiac CT Score test to measure the calcification level (if any) in your heart arteries.  This test has been ordered in our [place], so please call [facility] directly, as they prefer this, at [phone] to be scheduled.  You will be contacted regarding the referral for: Dermatology  Please make an Appointment to return for your 1 year visit, or sooner if needed, with Lab testing by Appointment as well, to be done about 3-5 days before at the [place] (so this is for TWO appointments - please see the scheduling desk as you leave)   Due to the ongoing Covid 19 pandemic, our lab now requires an appointment for any labs done at our office.  If you need labs done and do not have an appointment, please call our office ahead of time to schedule before presenting to the lab for your testing.

## 2021-05-02 NOTE — Progress Notes (Signed)
Patient ID: Peter Leblanc, male   DOB: [birth year], 65 y.o.   MRN: [id number]         Chief Complaint:: wellness exam and left foot first mtp arthritis, low vit d, left temple skin lesion, insomnia, and hld       HPI:  Peter Leblanc is a 65 y.o. male here for wellness exam; declines covid booster, shingrix o/w up to date                        Also has > 3 mo ongoing constant mild to mod at times first mtp arthritis pain and swellingk worse after standing most of the day at work, better to sit, sharp.  Not sleeping well but not really due to pain it seems, has ongoing work stress better and worse at times.  Has most recently gradually now very severe prostate obstructive symptoms and cannot even miss one dose flomax. For TURP very soon.  Not taking Vit D.  Also has a dark lesion to left temple new in past 6 mo non raised or tender but enlarging.  Trying to follow lower chol diet.  Pt denies chest pain, increased sob or doe, wheezing, orthopnea, PND, increased LE swelling, palpitations, dizziness or syncope.   Pt denies polydipsia, polyuria, or new focal neuro s/s.   Pt denies fever, wt loss, night sweats, loss of appetite, or other constitutional symptoms    Wt Readings from Last 3 Encounters:  05/02/21 181 lb 3.2 oz (82.2 kg)  02/21/21 180 lb 6.4 oz (81.8 kg)  12/04/20 181 lb (82.1 kg)   BP Readings from Last 3 Encounters:  05/02/21 138/80  02/21/21 122/78  12/04/20 124/82   Immunization History  Administered Date(s) Administered   Influenza Inj Mdck Quad Pf 03/31/2018   Influenza Whole 06/25/1997   Influenza,inj,Quad PF,6+ Mos 02/14/2014, 02/19/2019, 05/02/2021   Influenza-Unspecified 03/08/2015, 04/02/2020   PFIZER(Purple Top)SARS-COV-2 Vaccination 09/09/2019, 09/23/2019, 06/27/2020   Td 11/29/2009   Tdap 05/02/2021   There are no preventive care reminders to display for this patient.     Past Medical History:  Diagnosis Date   ALLERGIC RHINITIS 02/15/2007   Allergy    Anxiety  08/23/2011   BPH (benign prostatic hypertrophy) with urinary obstruction 11/16/2012   CONJUNCTIVITIS, INFECTIOUS 09/04/2008   ERECTILE DYSFUNCTION 09/26/2008   Genital herpes    HTN (hypertension) 12/06/2015   HYPERLIPIDEMIA 11/08/2007   KNEE PAIN, BILATERAL 11/29/2009   PSA, INCREASED 11/08/2007   SKIN LESION 11/29/2009   Slowing of urinary stream 09/04/2008   TIA (transient ischemic attack) 12/06/2015   VULVAR ABSCESS 11/27/2009   Past Surgical History:  Procedure Laterality Date   COLONOSCOPY  12/02/2006   w/Brodie    GANGLION CYST EXCISION     NASAL SINUS SURGERY  1997   PROSTATE BIOPSY     TONSILLECTOMY     VASECTOMY      reports that he has never smoked. He has never used smokeless tobacco. He reports current alcohol use of about 14.0 standard drinks per week. He reports that he does not use drugs. He was adopted. Family history is unknown by patient. No Known Allergies Current Outpatient Medications on File Prior to Visit  Medication Sig Dispense Refill   aspirin EC 81 MG tablet Take 81 mg by mouth daily.     tamsulosin (FLOMAX) 0.4 MG CAPS capsule TAKE 1 CAPSULE BY MOUTH TWICE A DAY 180 capsule 3   TURMERIC PO Take by mouth.  No current facility-administered medications on file prior to visit.        ROS:  All others reviewed and negative.  Objective        PE:  BP 138/80 (BP Location: Right Arm, Patient Position: Sitting, Cuff Size: Large)   Pulse 80   Temp 98.2 F (36.8 C) (Oral)   Ht 5\' 10"  (1.778 m)   Wt 181 lb 3.2 oz (82.2 kg)   SpO2 96%   BMI 26.00 kg/m                 Constitutional: Pt appears in NAD               HENT: Head: NCAT.                Right Ear: External ear normal.                 Left Ear: External ear normal.                Eyes: . Pupils are equal, round, and reactive to light. Conjunctivae and EOM are normal               Nose: without d/c or deformity               Neck: Neck supple. Gross normal ROM               Cardiovascular: Normal  rate and regular rhythm.                 Pulmonary/Chest: Effort normal and breath sounds without rales or wheezing.                Abd:  Soft, NT, ND, + BS, no organomegaly               Neurological: Pt is alert. At baseline orientation, motor grossly intact               Skin: has new dark lesion 1/2 cm left temple, LE edema - none  \               Left first mtp with 1-2+ chronic appearing mild tender degenerative change               Psychiatric: Pt behavior is normal without agitation   Micro: none  Cardiac tracings I have personally interpreted today:  none  Pertinent Radiological findings (summarize): none   Lab Results  Component Value Date   WBC 6.0 04/28/2021   HGB 14.1 04/28/2021   HCT 41.1 04/28/2021   PLT 227.0 04/28/2021   GLUCOSE 94 04/28/2021   CHOL 144 04/28/2021   TRIG 81.0 04/28/2021   HDL 57.90 04/28/2021   LDLDIRECT 86.0 12/04/2020   LDLCALC 70 04/28/2021   ALT 22 04/28/2021   AST 23 04/28/2021   NA 139 04/28/2021   K 4.4 04/28/2021   CL 104 04/28/2021   CREATININE 0.80 04/28/2021   BUN 15 04/28/2021   CO2 28 04/28/2021   TSH 1.45 04/28/2021   PSA 9.33 (H) 04/28/2021   HGBA1C 5.7 04/28/2021   Assessment/Plan:  Peter Leblanc is a 65 y.o. White or Caucasian [1] male with  has a past medical history of ALLERGIC RHINITIS (02/15/2007), Allergy, Anxiety (08/23/2011), BPH (benign prostatic hypertrophy) with urinary obstruction (11/16/2012), CONJUNCTIVITIS, INFECTIOUS (09/04/2008), ERECTILE DYSFUNCTION (09/26/2008), Genital herpes, HTN (hypertension) (12/06/2015), HYPERLIPIDEMIA (11/08/2007), KNEE PAIN, BILATERAL (11/29/2009), PSA, INCREASED (11/08/2007), SKIN LESION (11/29/2009), Slowing of urinary stream (  09/04/2008), TIA (transient ischemic attack) (12/06/2015), and VULVAR ABSCESS (11/27/2009).  Encounter for well adult exam with abnormal findings Age and sex appropriate education and counseling updated with regular exercise and diet Referrals for preventative services -  none needed Immunizations addressed - declines covid booster and shingrix, for flu and Tdap today Smoking counseling  - none needed Evidence for depression or other mood disorder - none significant Most recent labs reviewed. I have personally reviewed and have noted: 1) the patient's medical and social history 2) The patient's current medications and supplements 3) The patient's height, weight, and BMI have been recorded in the chart   HLD (hyperlipidemia) Lab Results  Component Value Date   LDLCALC 70 04/28/2021   Stable, pt to continue current statin lipitor 20, and check cardiac Ct score   HTN (hypertension) BP Readings from Last 3 Encounters:  05/02/21 138/80  02/21/21 122/78  12/04/20 124/82   Stable, pt to continue medical treatment losartan   Hyperglycemia Lab Results  Component Value Date   HGBA1C 5.7 04/28/2021   Stable, pt to continue current medical treatment  - diet   Skin lesion Left temple area - for derm referral  Arthritis of foot, left Mild worsening uncontrolled, for mobic prn,  to f/u any worsening symptoms or concerns  Vitamin D deficiency Last vitamin D Lab Results  Component Value Date   VD25OH 31.84 04/28/2021   Ow, to start oral replacement   Insomnia Mild recent worsening, for otc melatonin 10 qhs prn,  to f/u any worsening symptoms or concerns  Followup: Return in about 1 year (around 05/02/2022).  Cathlean Cower, MD 05/03/2021 7:12 PM [facility]

## 2021-05-03 ENCOUNTER — Encounter: Payer: Self-pay | Admitting: Internal Medicine

## 2021-05-03 DIAGNOSIS — E559 Vitamin D deficiency, unspecified: Secondary | ICD-10-CM | POA: Insufficient documentation

## 2021-05-03 DIAGNOSIS — G4700 Insomnia, unspecified: Secondary | ICD-10-CM | POA: Insufficient documentation

## 2021-05-03 DIAGNOSIS — M19072 Primary osteoarthritis, left ankle and foot: Secondary | ICD-10-CM | POA: Insufficient documentation

## 2021-05-03 NOTE — Assessment & Plan Note (Signed)
Mild recent worsening, for otc melatonin 10 qhs prn,  to f/u any worsening symptoms or concerns

## 2021-05-03 NOTE — Assessment & Plan Note (Signed)
Mild worsening uncontrolled, for mobic prn,  to f/u any worsening symptoms or concerns

## 2021-05-03 NOTE — Assessment & Plan Note (Signed)
Last vitamin D Lab Results  Component Value Date   VD25OH 31.84 04/28/2021   Ow, to start oral replacement

## 2021-05-03 NOTE — Assessment & Plan Note (Signed)
Lab Results  Component Value Date   LDLCALC 70 04/28/2021   Stable, pt to continue current statin lipitor 20, and check cardiac Ct score

## 2021-05-03 NOTE — Assessment & Plan Note (Signed)
Lab Results  Component Value Date   HGBA1C 5.7 04/28/2021   Stable, pt to continue current medical treatment  - diet

## 2021-05-03 NOTE — Assessment & Plan Note (Signed)
BP Readings from Last 3 Encounters:  05/02/21 138/80  02/21/21 122/78  12/04/20 124/82   Stable, pt to continue medical treatment losartan

## 2021-05-03 NOTE — Assessment & Plan Note (Signed)
Age and sex appropriate education and counseling updated with regular exercise and diet Referrals for preventative services - none needed Immunizations addressed - declines covid booster and shingrix, for flu and Tdap today Smoking counseling  - none needed Evidence for depression or other mood disorder - none significant Most recent labs reviewed. I have personally reviewed and have noted: 1) the patient's medical and social history 2) The patient's current medications and supplements 3) The patient's height, weight, and BMI have been recorded in the chart

## 2021-05-03 NOTE — Assessment & Plan Note (Signed)
Left temple area - for derm referral

## 2021-05-19 ENCOUNTER — Ambulatory Visit: Payer: No Typology Code available for payment source

## 2021-05-20 ENCOUNTER — Telehealth: Payer: Self-pay | Admitting: Internal Medicine

## 2021-05-20 NOTE — Telephone Encounter (Signed)
Called to inform that both [facility] and [facility] do not participate with the patient's insurance. They are going to close out referral on their side.

## 2021-05-20 NOTE — Telephone Encounter (Signed)
Left patient message to confirm insurance and who  he/insurance prefers  he use for Dermatologist

## 2021-05-28 ENCOUNTER — Ambulatory Visit (INDEPENDENT_AMBULATORY_CARE_PROVIDER_SITE_OTHER): Payer: No Typology Code available for payment source

## 2021-05-28 ENCOUNTER — Other Ambulatory Visit: Payer: Self-pay

## 2021-05-28 DIAGNOSIS — Z23 Encounter for immunization: Secondary | ICD-10-CM

## 2021-06-16 IMAGING — CT CT NECK W/ CM
2 of 3 series · 7 of 14 positions shown, 9 images · IV contrast (iopamidol)
Comparison: None.

CLINICAL DATA: Neck lymphadenopathy.

EXAM:
CT NECK WITH CONTRAST
TECHNIQUE: Multidetector CT imaging of the neck was performed using the
standard protocol following the bolus administration of intravenous
contrast.
CONTRAST:  75mL 7H4MRL-FLD IOPAMIDOL (7H4MRL-FLD) INJECTION 76%

[Series 3: neck · axial · 0.49mm/px · z∈[-358,-174]mm · 5 of 138 slices shown, 7 images]
[im 23/138  soft-tissue]
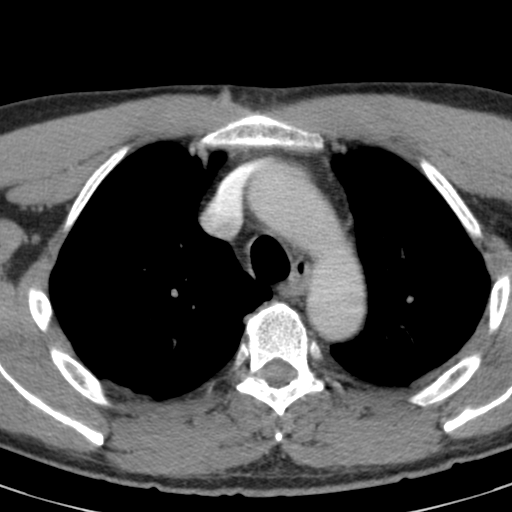
[im 23/138  bone]
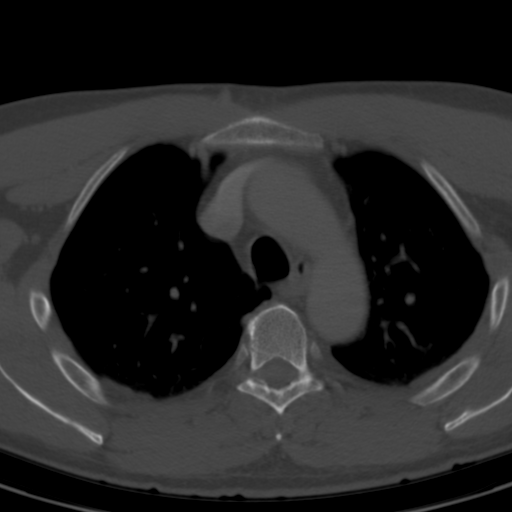
[im 46/138  bone]
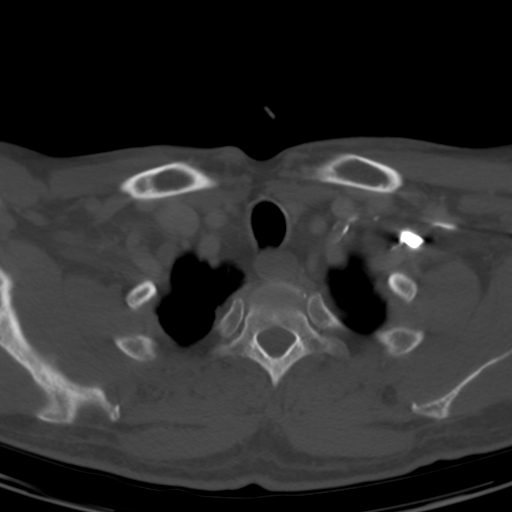
[im 69/138  bone]
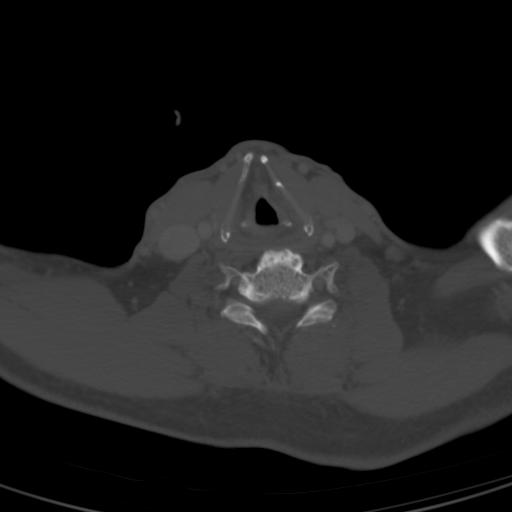
[im 92/138  bone]
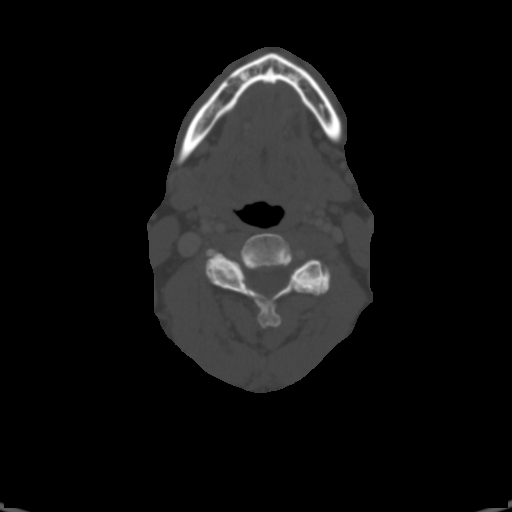
[im 115/138  soft-tissue]
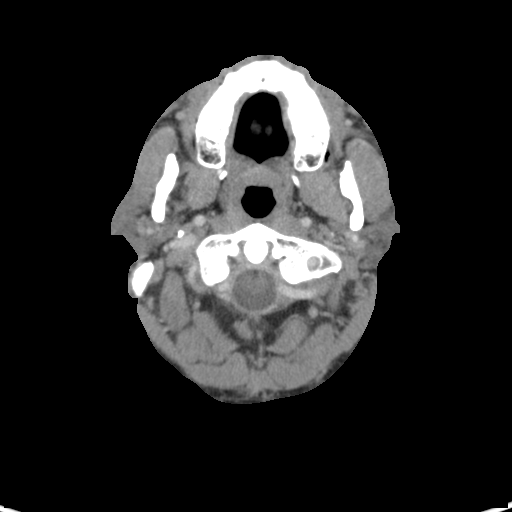
[im 115/138  bone]
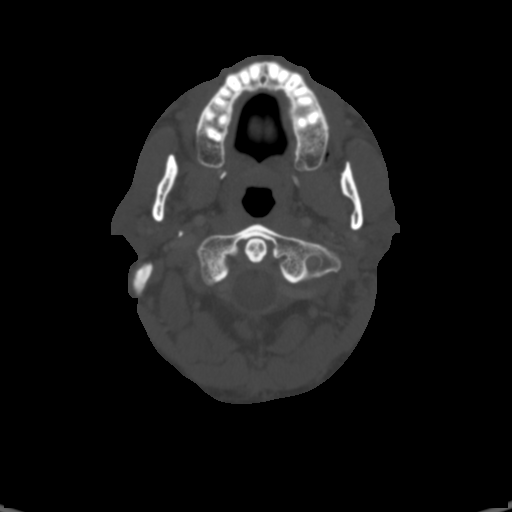

[Series 7: angled (person_name) · axial · 0.39mm/px · z∈[-189,-143]mm · 2 of 72 slices shown]
[im 24/72  bone]
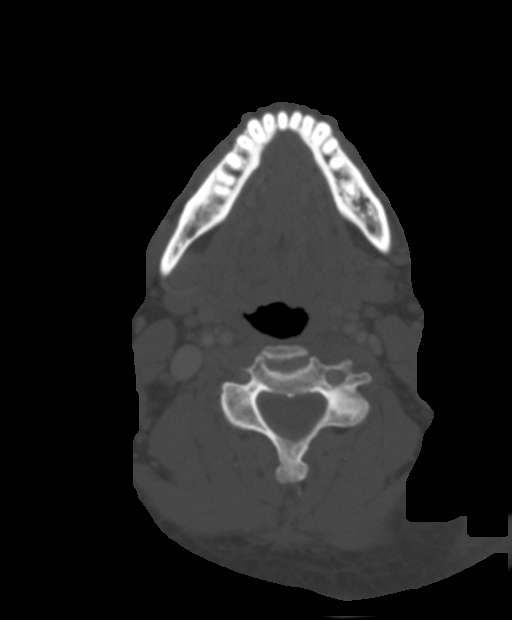
[im 48/72  bone]
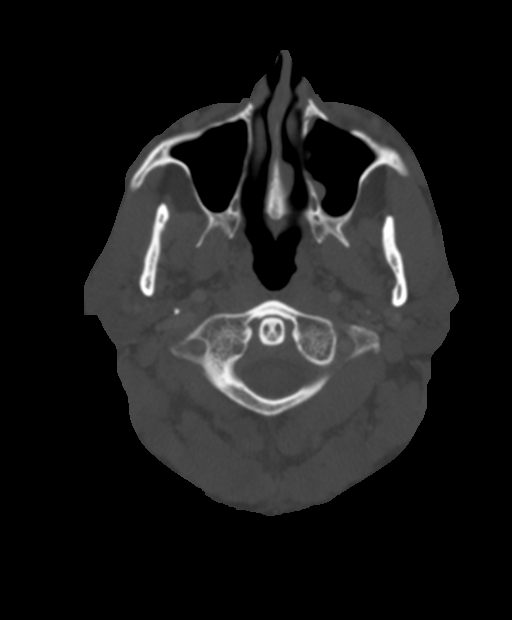

[7 of 14 positions shown; findings below may reference images not displayed]

FINDINGS: Pharynx and larynx: Normal. No mass or swelling.

Salivary glands: No inflammation, mass, or stone.

Thyroid: Subcentimeter hypodense thyroid nodule.

Lymph nodes: None enlarged or abnormal density.

Vascular: Calcified plaques in the bilateral carotid bifurcation
without high-grade stenosis

Limited intracranial: Negative.

Visualized orbits: Negative.

Mastoids and visualized paranasal sinuses: Mild mucosal thickening
of the left maxillary sinus.

Skeleton: Degenerative changes of the cervical spine.

Upper chest: Negative.

Other: None.
IMPRESSION: 1. No neck mass or adenopathy.
2. Mild mucosal thickening of the left maxillary sinus.

## 2021-09-10 ENCOUNTER — Other Ambulatory Visit: Payer: Self-pay | Admitting: Internal Medicine

## 2021-09-10 NOTE — Telephone Encounter (Signed)
Please refill as per office routine med refill policy (all routine meds to be refilled for 3 mo or monthly (per pt preference) up to one year from last visit, then month to month grace period for 3 mo, then further med refills will have to be denied) ? ?

## 2021-10-01 ENCOUNTER — Ambulatory Visit: Payer: No Typology Code available for payment source

## 2021-10-08 ENCOUNTER — Ambulatory Visit (INDEPENDENT_AMBULATORY_CARE_PROVIDER_SITE_OTHER): Payer: No Typology Code available for payment source | Admitting: *Deleted

## 2021-10-08 DIAGNOSIS — Z23 Encounter for immunization: Secondary | ICD-10-CM | POA: Diagnosis not present

## 2021-10-08 NOTE — Progress Notes (Signed)
Pls cosign for shingle inj.Marland KitchenJohny Leblanc ? ?

## 2021-12-01 ENCOUNTER — Ambulatory Visit: Payer: No Typology Code available for payment source | Admitting: Dermatology

## 2021-12-01 ENCOUNTER — Encounter: Payer: Self-pay | Admitting: Dermatology

## 2021-12-01 DIAGNOSIS — L603 Nail dystrophy: Secondary | ICD-10-CM | POA: Diagnosis not present

## 2021-12-01 DIAGNOSIS — L918 Other hypertrophic disorders of the skin: Secondary | ICD-10-CM

## 2021-12-01 DIAGNOSIS — Z1283 Encounter for screening for malignant neoplasm of skin: Secondary | ICD-10-CM | POA: Diagnosis not present

## 2021-12-01 DIAGNOSIS — L570 Actinic keratosis: Secondary | ICD-10-CM | POA: Diagnosis not present

## 2021-12-22 ENCOUNTER — Encounter: Payer: Self-pay | Admitting: Dermatology

## 2021-12-22 NOTE — Progress Notes (Signed)
   New Patient   Subjective  Peter Leblanc is a 66 y.o. male who presents for the following: Annual Exam (2 lesions on back per wife & nails- peel ).  General skin examination, several crusts plus check fingernail Location:  Duration:  Quality:  Associated Signs/Symptoms: Modifying Factors:  Severity:  Timing: Context:    The following portions of the chart were reviewed this encounter and updated as appropriate:  Tobacco  Allergies  Meds  Problems  Med Hx  Surg Hx  Fam Hx      Objective  Well appearing patient in no apparent distress; mood and affect are within normal limits. Full body skin examination: No atypical pigmented lesions (all checked with dermoscopy).  No nonmelanoma skin cancer.  Left Forehead, Right Lower Back Gritty 5 mm pink crusts  Left Inguinal Area 2 mm pedunculated flesh-colored papule  Right 2nd Finger Nail Plate Longitudinal striation with some distal peeling suggestive of localized onychoschizia's.  Toenails are involved making tinea less likely    A full examination was performed including scalp, head, eyes, ears, nose, lips, neck, chest, axillae, abdomen, back, buttocks, bilateral upper extremities, bilateral lower extremities, hands, feet, fingers, toes, fingernails, and toenails. All findings within normal limits unless otherwise noted below.   Assessment & Plan  AK (actinic keratosis) (2) Right Lower Back; Left Forehead  Destruction of lesion - Left Forehead, Right Lower Back Complexity: simple   Destruction method: cryotherapy   Informed consent: discussed and consent obtained   Timeout:  patient name, date of birth, surgical site, and procedure verified Lesion destroyed using liquid nitrogen: Yes   Cryotherapy cycles:  3 Outcome: patient tolerated procedure well with no complications    Encounter for screening for malignant neoplasm of skin  Annual skin examination.  Encouraged to continue to self examine with spouse twice  annually.  Skin tag Left Inguinal Area  May choose future excision.  Onychodystrophy Right 2nd Finger Nail Plate  Intervention initiated.

## 2021-12-31 IMAGING — DX DG CHEST 2V
2 series · 2 of 2 positions shown · non-contrast
Comparison: None.

CLINICAL DATA: Recurring rapid palpitations, tachycardia for 6
months

EXAM:
CHEST - 2 VIEW

[chest pa]
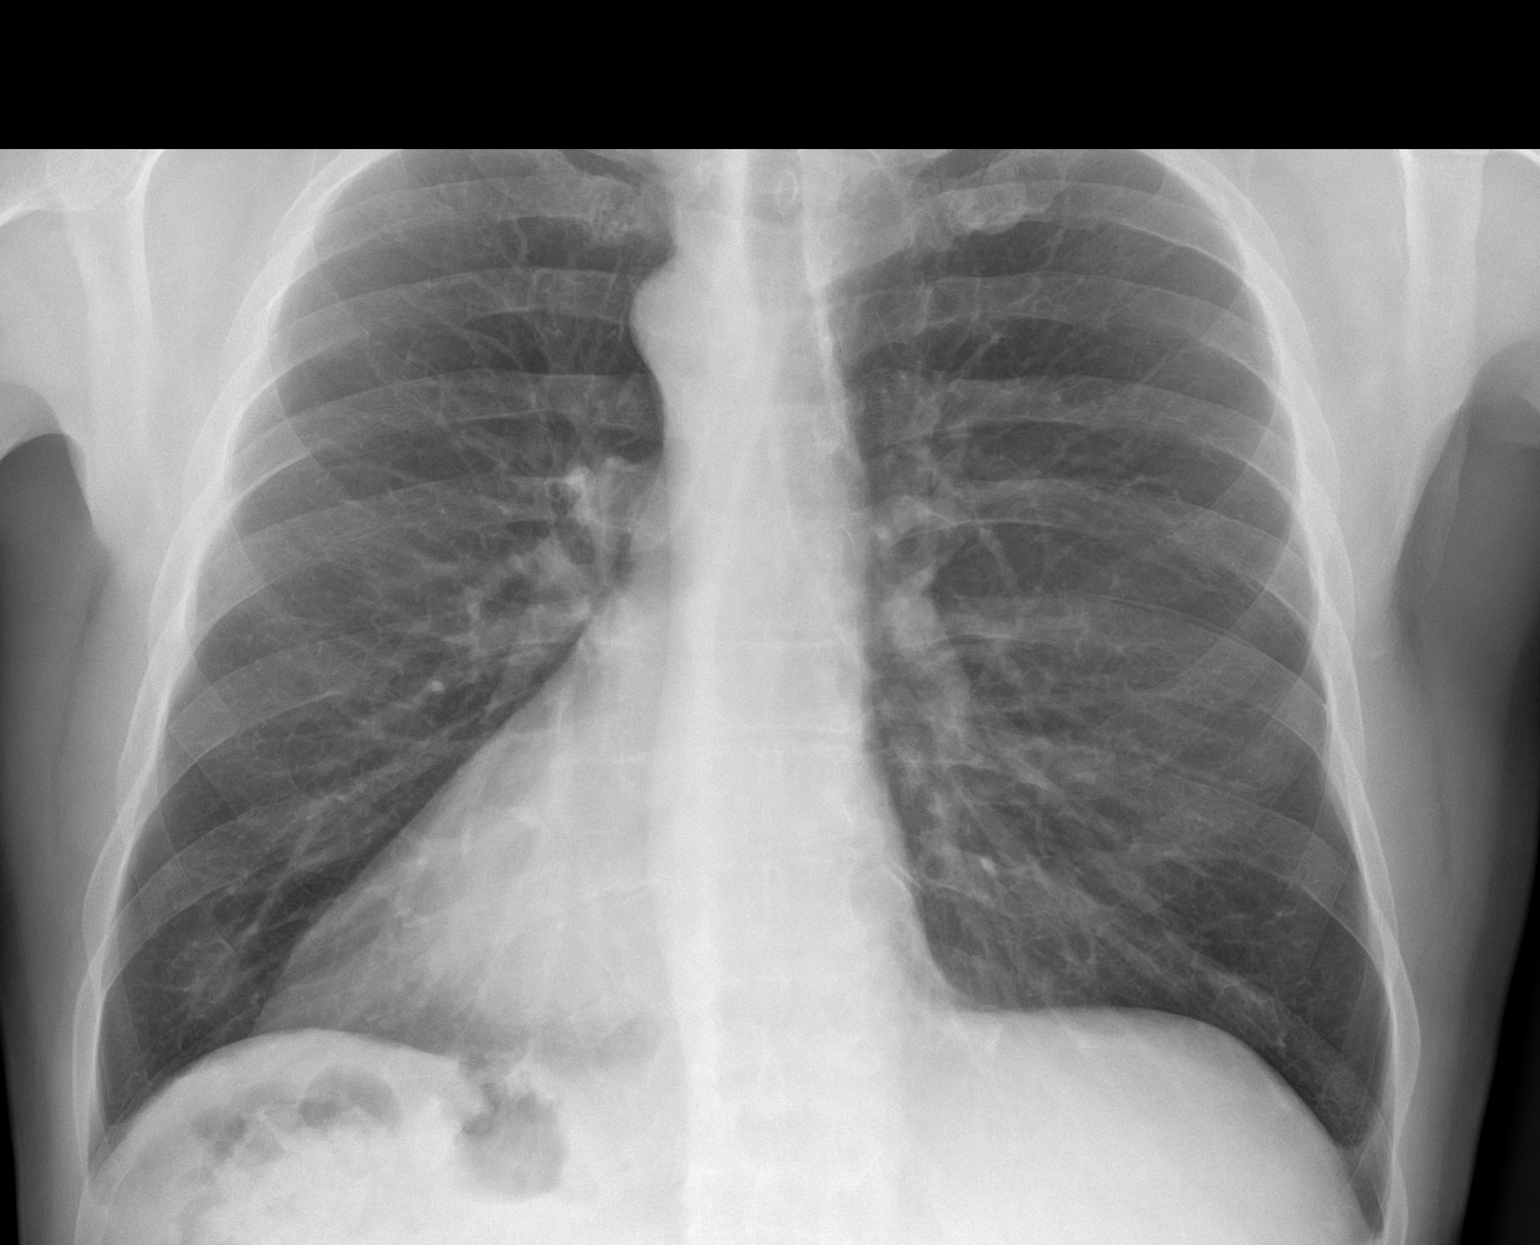

[chest lat]
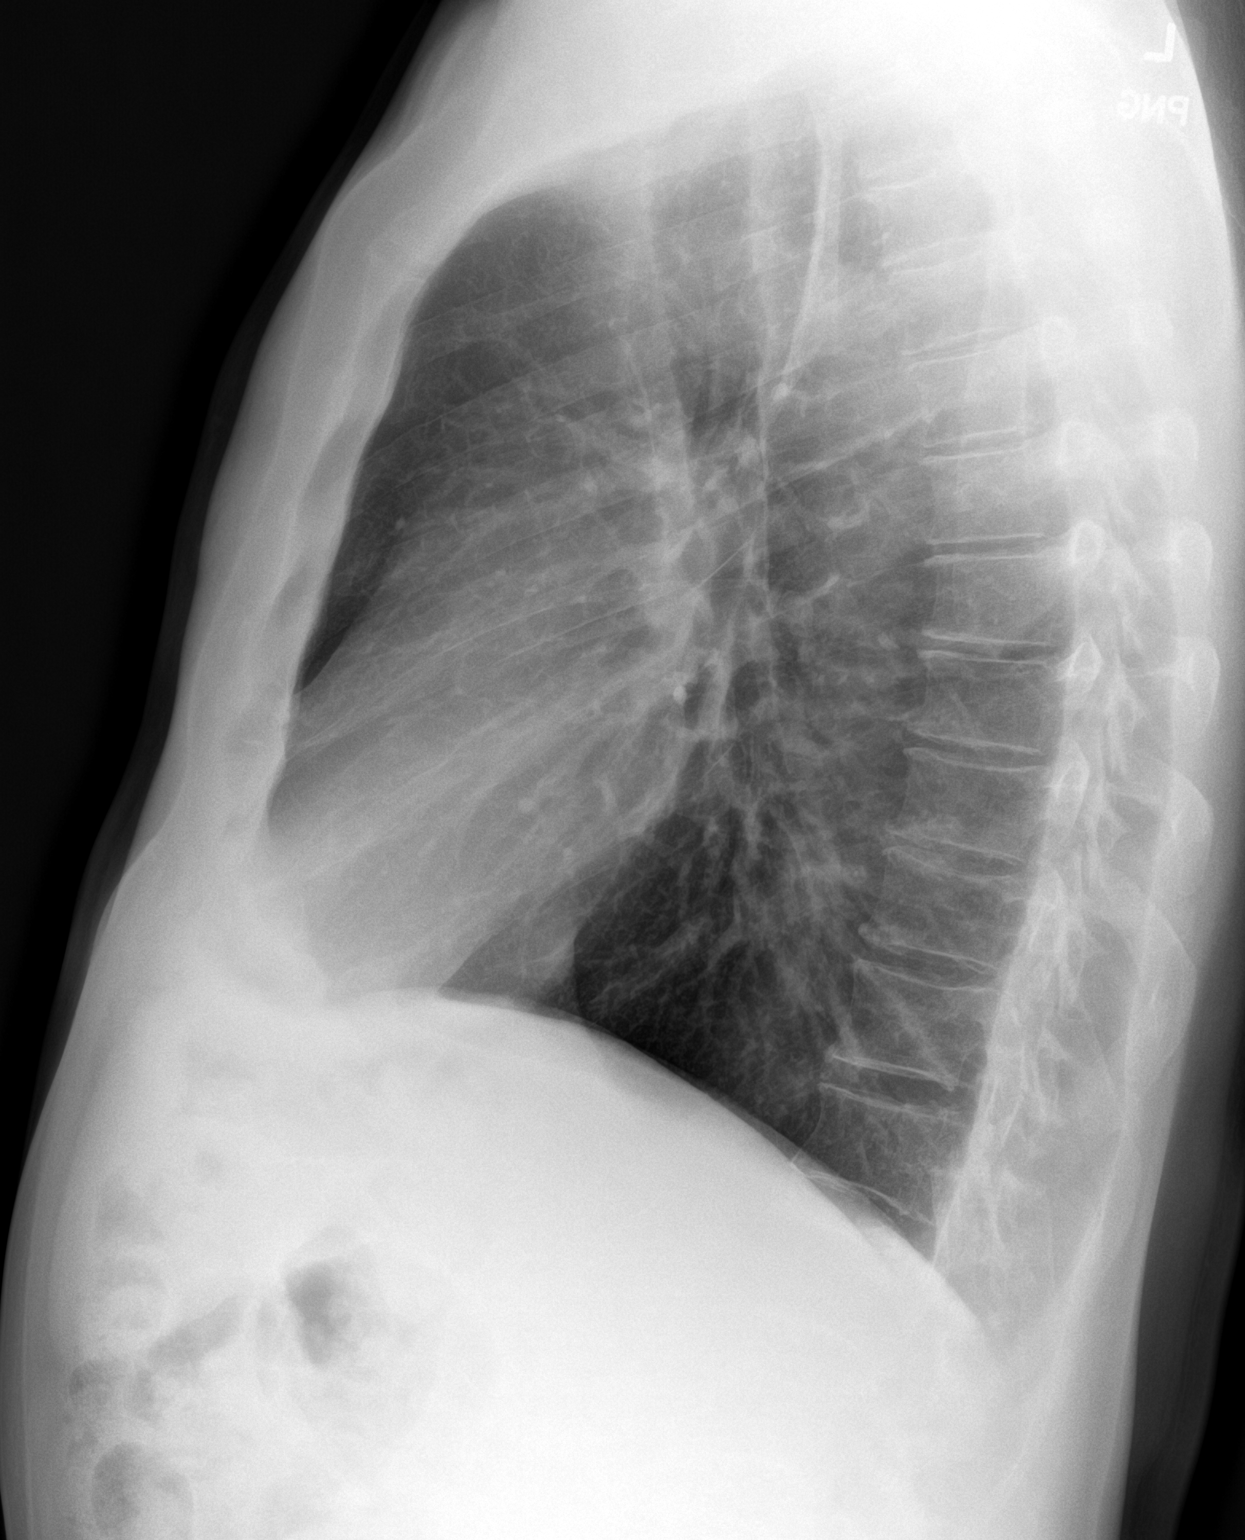

[2 of 2 positions shown; findings below may reference images not displayed]

FINDINGS: The heart size and mediastinal contours are within normal limits.
Both lungs are clear. The visualized skeletal structures are
unremarkable.
IMPRESSION: No active cardiopulmonary disease.

## 2022-02-17 ENCOUNTER — Telehealth (INDEPENDENT_AMBULATORY_CARE_PROVIDER_SITE_OTHER): Payer: No Typology Code available for payment source | Admitting: Family Medicine

## 2022-02-17 DIAGNOSIS — U071 COVID-19: Secondary | ICD-10-CM | POA: Diagnosis not present

## 2022-02-17 MED ORDER — MOLNUPIRAVIR EUA 200MG CAPSULE: 4.0000 | ORAL_CAPSULE | Freq: Two times a day (BID) | ORAL | 0 refills | Status: AC

## 2022-02-17 MED ORDER — BENZONATATE 100 MG PO CAPS: ORAL_CAPSULE | ORAL | 0 refills | Status: DC

## 2022-02-17 NOTE — Progress Notes (Signed)
Virtual Visit via Video Note  I connected with Peter Leblanc  on 02/17/22 at  4:40 PM EDT by a video enabled telemedicine application and verified that I am speaking with the correct person using two identifiers.  Location patient: [place] Location provider:work or home office Persons participating in the virtual visit: patient, provider  I discussed the limitations and requested verbal permission for telemedicine visit. The patient expressed understanding and agreed to proceed.   HPI:  Acute telemedicine visit for Covid19: -Onset: 2 days ago -Symptoms include: nasal congestion, cough, body aches, a tight cough, low grade fever, headache -Denies:CP, SOB, NVD -drinking plenty of fluids -Has tried: OTC analgesic, nyquil, dayquil -Pertinent past medical history: see below, had covid once or twice in the past, GFR was 93 in last one year -Pertinent medication allergies:No Known Allergies -COVID-19 vaccine status: had 2 doses and one booster Immunization History  Administered Date(s) Administered   Influenza Inj Mdck Quad Pf 03/31/2018   Influenza Whole 06/25/1997   Influenza,inj,Quad PF,6+ Mos 02/14/2014, 02/19/2019, 05/02/2021   Influenza-Unspecified 03/08/2015, 04/02/2020   PFIZER(Purple Top)SARS-COV-2 Vaccination 09/09/2019, 09/23/2019, 06/27/2020   Td 11/29/2009   Tdap 05/02/2021   Zoster Recombinat (Shingrix) 05/28/2021, 10/08/2021     ROS: See pertinent positives and negatives per HPI.  Past Medical History:  Diagnosis Date   ALLERGIC RHINITIS 02/15/2007   Allergy    Anxiety 08/23/2011   BPH (benign prostatic hypertrophy) with urinary obstruction 11/16/2012   CONJUNCTIVITIS, INFECTIOUS 09/04/2008   ERECTILE DYSFUNCTION 09/26/2008   Genital herpes    HTN (hypertension) 12/06/2015   HYPERLIPIDEMIA 11/08/2007   KNEE PAIN, BILATERAL 11/29/2009   PSA, INCREASED 11/08/2007   SKIN LESION 11/29/2009   Slowing of urinary stream 09/04/2008   TIA (transient ischemic attack) 12/06/2015   VULVAR ABSCESS  11/27/2009    Past Surgical History:  Procedure Laterality Date   COLONOSCOPY  12/02/2006   w/Brodie    GANGLION CYST EXCISION     NASAL SINUS SURGERY  1997   PROSTATE BIOPSY     TONSILLECTOMY     VASECTOMY       Current Outpatient Medications:    benzonatate (TESSALON PERLES) 100 MG capsule, 1-2 capsules up to twice daily as needed for cough., Disp: 30 capsule, Rfl: 0   molnupiravir EUA (LAGEVRIO) 200 mg CAPS capsule, Take 4 capsules (800 mg total) by mouth 2 (two) times daily for 5 days., Disp: 40 capsule, Rfl: 0   aspirin EC 81 MG tablet, Take 81 mg by mouth daily., Disp: , Rfl:    atorvastatin (LIPITOR) 20 MG tablet, Take 1 tablet (20 mg total) by mouth daily., Disp: 90 tablet, Rfl: 3   CIALIS 5 MG tablet, Take 1 tablet (5 mg total) by mouth daily., Disp: 10 tablet, Rfl: 6   losartan (COZAAR) 50 MG tablet, TAKE ONE TABLET BY MOUTH EVERY DAY, Disp: 90 tablet, Rfl: 2   meloxicam (MOBIC) 15 MG tablet, TAKE 1 TABLET BY MOUTH EVERY DAY AS NEEDED, Disp: 90 tablet, Rfl: 3   tamsulosin (FLOMAX) 0.4 MG CAPS capsule, TAKE 1 CAPSULE BY MOUTH TWICE A DAY, Disp: 180 capsule, Rfl: 3   TURMERIC PO, Take by mouth., Disp: , Rfl:   EXAM:  VITALS per patient if applicable:  GENERAL: alert, oriented, appears well and in no acute distress  HEENT: atraumatic, conjunttiva clear, no obvious abnormalities on inspection of external nose and ears  NECK: normal movements of the head and neck  LUNGS: on inspection no signs of respiratory distress, breathing rate appears  normal, no obvious gross SOB, gasping or wheezing  CV: no obvious cyanosis  MS: moves all visible extremities without noticeable abnormality  PSYCH/NEURO: pleasant and cooperative, no obvious depression or anxiety, speech and thought processing grossly intact  ASSESSMENT AND PLAN:  Discussed the following assessment and plan:  COVID-19   Discussed treatment options, side effect and risk of drug interactions, ideal treatment  window, potential complications, isolation and precautions for COVID-19.  Discussed possibility of rebound with or without antivirals. Checked for/reviewed last GFR - listed in HPI if available.  After lengthy discussion, the patient opted for treatment with Legevrio due to being higher risk for complications of covid or severe disease and other factors . Discussed EUA status of this drug and the fact that there is preliminary limited knowledge of risks/interactions/side effects per EUA document vs possible benefits and precautions. This information was shared with patient during the visit and also was provided in patient instructions. The patient did want a prescription for cough, Tessalon Rx sent.  Other symptomatic care measures summarized in patient instructions. Work/School slipped offered: provided in patient instructions   Advised to seek prompt virtual visit or in person care if worsening, new symptoms arise, or if is not improving with treatment as expected per our conversation of expected course. Discussed options for follow up care. Did let this patient know that I do telemedicine on Tuesdays and Thursdays for [place] and those are the days I am logged into the system. Advised to schedule follow up visit with PCP, [place] virtual visits or [facility] if any further questions or concerns to avoid delays in care.   I discussed the assessment and treatment plan with the patient. The patient was provided an opportunity to ask questions and all were answered. The patient agreed with the plan and demonstrated an understanding of the instructions.     Lucretia Kern, DO

## 2022-02-17 NOTE — Patient Instructions (Addendum)
---------------------------------------------------------------------------------------------------------------------------    WORK SLIP:  Patient Peter Leblanc,  August 30, 1955, was seen for a medical visit today, 02/17/22 . Please excuse from work for a COVID illness. Patient will likely be contagious for an average of 7-10 days from the onset of symptoms, PLUS 1 day of no fever and improved symptoms. Other viruses tend to be contagious for 5-10 days on average. Will defer to employer for a sooner return to work if patient has 2 negative covid tests 48 hours apart and is feeling better, OR if symptoms have resolved or improved, it is greater than 5 days since the positive test and the patient can wear a high-quality, tight fitting mask such as N95 or KN95 at all times for an additional 5 days.  Sincerely: E-signature: Dr. Colin Benton, DO [facility] [place] Ph: [phone]   ------------------------------------------------------------------------------------------------------------------------------    HOME CARE TIPS:   -I sent the medication(s) we discussed to your pharmacy: Meds ordered this encounter  Medications   molnupiravir EUA (LAGEVRIO) 200 mg CAPS capsule    Sig: Take 4 capsules (800 mg total) by mouth 2 (two) times daily for 5 days.    Dispense:  40 capsule    Refill:  0   benzonatate (TESSALON PERLES) 100 MG capsule    Sig: 1-2 capsules up to twice daily as needed for cough.    Dispense:  30 capsule    Refill:  0     -I sent in the [place] treatment or referral you requested per our discussion. Please see the information provided below and discuss further with the pharmacist/treatment team.    -there is a chance of rebound illness with covid after improving. This can happen whether or not you take an antiviral treatment. If you become sick again with covid after getting better, please schedule a follow up virtual visit and isolate again.  -can use  tylenol if needed for fevers, aches and pains per instructions  -nasal saline sinus rinses twice daily  -stay hydrated, drink plenty of fluids and eat small healthy meals - avoid dairy  -follow up with your doctor in 2-3 days unless improving and feeling better  -stay home while sick, except to seek medical care. If you have COVID19, you will likely be contagious for 7-10 days. Flu or Influenza is likely contagious for about 7 days. Other respiratory viral infections remain contagious for 5-10+ days depending on the virus and many other factors. Wear a good mask that fits snugly (such as N95 or KN95) if around others to reduce the risk of transmission.  It was nice to meet you today, and I really hope you are feeling better soon. I help [facility] out with telemedicine visits on Tuesdays and Thursdays and am happy to help if you need a follow up virtual visit on those days. Otherwise, if you have any concerns or questions following this visit please schedule a follow up visit with your Primary Care doctor or seek care at a local urgent care clinic to avoid delays in care.    Seek in person care or schedule a follow up video visit promptly if your symptoms worsen, new concerns arise or you are not improving with treatment. Call 911 and/or seek emergency care if your symptoms are severe or life threatening.   PLEASE SEE THE FOLLOWING LINK FOR THE MOST UPDATED INFORMATION ABOUT LAGEVRIO:  [email or web address]      Fact Sheet for Patients And Caregivers Emergency Use Authorization (EUA) Of LAGEVRIOT (  molnupiravir) capsules For Coronavirus Disease 2019 (COVID-19)  What is the most important information I should know about LAGEVRIO? LAGEVRIO may cause serious side effects, including: ? LAGEVRIO may cause harm to your unborn baby. It is not known if LAGEVRIO will harm your baby if you take LAGEVRIO during pregnancy. o LAGEVRIO is not recommended for use in pregnancy. o LAGEVRIO has not  been studied in pregnancy. LAGEVRIO was studied in pregnant animals only. When LAGEVRIO was given to pregnant animals, LAGEVRIO caused harm to their unborn babies. o You and your healthcare provider may decide that you should take LAGEVRIO during pregnancy if there are no other COVID-19 treatment options approved or authorized by the FDA that are accessible or clinically appropriate for you. o If you and your healthcare provider decide that you should take LAGEVRIO during pregnancy, you and your healthcare provider should discuss the known and potential benefits and the potential risks of taking LAGEVRIO during pregnancy. For individuals who are able to become pregnant: ? You should use a reliable method of birth control (contraception) consistently and correctly during treatment with LAGEVRIO and for 4 days after the last dose of LAGEVRIO. Talk to your healthcare provider about reliable birth control methods. ? Before starting treatment with [facility] your healthcare provider may do a pregnancy test to see if you are pregnant before starting treatment with LAGEVRIO. ? Tell your healthcare provider right away if you become pregnant or think you may be pregnant during treatment with LAGEVRIO. Pregnancy Surveillance Program: ? There is a pregnancy surveillance program for individuals who take LAGEVRIO during pregnancy. The purpose of this program is to collect information about the health of you and your baby. Talk to your healthcare provider about how to take part in this program. ? If you take LAGEVRIO during pregnancy and you agree to participate in the pregnancy surveillance program and allow your healthcare provider to share your information with [place], then your healthcare provider will report your use of [place] during pregnancy to [place]. by calling [phone] or [email or web address]. For individuals who are sexually active with partners who  are able to become pregnant: ? It is not known if LAGEVRIO can affect sperm. While the risk is regarded as low, animal studies to fully assess the potential for LAGEVRIO to affect the babies of males treated with LAGEVRIO have not been completed. A reliable method of birth control (contraception) should be used consistently and correctly during treatment with LAGEVRIO and for at least 3 months after the last dose. The risk to sperm beyond 3 months is not known. Studies to understand the risk to sperm beyond 3 months are ongoing. Talk to your healthcare provider about reliable birth control methods. Talk to your healthcare provider if you have questions or concerns about how LAGEVRIO may affect sperm. You are being given this fact sheet because your healthcare provider believes it is necessary to provide you with LAGEVRIO for the treatment of adults with mild-to-moderate coronavirus disease 2019 (COVID-19) with positive results of direct SARS-CoV-2 viral testing, and who are at high risk for progression to severe COVID-19 including hospitalization or death, and for whom other COVID-19 treatment options approved or authorized by the FDA are not accessible or clinically appropriate. The U.S. Food and Drug Administration (FDA) has issued an Emergency Use Authorization (EUA) to make LAGEVRIO available during the COVID-19 pandemic (for more details about an EUA please see "What is an Emergency Use Authorization?" at the end of this  document). LAGEVRIO is not an FDA-approved medicine in the [place]. Read this Fact Sheet for information about LAGEVRIO. Talk to your healthcare provider about your options if you have any questions. It is your choice to take LAGEVRIO.  What is COVID-19? COVID-19 is caused by a virus called a coronavirus. You can get COVID-19 through close contact with another person who has the virus. COVID-19 illnesses have ranged from very mild-to-severe, including illness  resulting in death. While information so far suggests that most COVID-19 illness is mild, serious illness can happen and may cause some of your other medical conditions to become worse. Older people and people of all ages with severe, long lasting (chronic) medical conditions like heart disease, lung disease and diabetes, for example seem to be at higher risk of being hospitalized for COVID-19.  What is LAGEVRIO? LAGEVRIO is an investigational medicine used to treat mild-to-moderate COVID-19 in adults: ? with positive results of direct SARS-CoV-2 viral testing, and ? who are at high risk for progression to severe COVID-19 including hospitalization or death, and for whom other COVID-19 treatment options approved or authorized by the FDA are not accessible or clinically appropriate. The FDA has authorized the emergency use of LAGEVRIO for the treatment of mild-tomoderate COVID-19 in adults under an EUA. For more information on EUA, see the "What is an Emergency Use Authorization (EUA)?" section at the end of this Fact Sheet. LAGEVRIO is not authorized: ? for use in people less than 50 years of age. ? for prevention of COVID-19. ? for people needing hospitalization for COVID-19. ? for use for longer than 5 consecutive days.  What should I tell my healthcare provider before I take LAGEVRIO? Tell your healthcare provider if you: ? Have any allergies ? Are breastfeeding or plan to breastfeed ? Have any serious illnesses ? Are taking any medicines (prescription, over-the-counter, vitamins, or herbal products).  How do I take LAGEVRIO? ? Take LAGEVRIO exactly as your healthcare provider tells you to take it. ? Take 4 capsules of LAGEVRIO every 12 hours (for example, at 8 am and at 8 pm) ? Take LAGEVRIO for 5 days. It is important that you complete the full 5 days of treatment with LAGEVRIO. Do not stop taking LAGEVRIO before you complete the full 5 days of treatment, even if you feel  better. ? Take LAGEVRIO with or without food. ? You should stay in isolation for as long as your healthcare provider tells you to. Talk to your healthcare provider if you are not sure about how to properly isolate while you have COVID-19. ? Swallow LAGEVRIO capsules whole. Do not open, break, or crush the capsules. If you cannot swallow capsules whole, tell your healthcare provider. ? What to do if you miss a dose: o If it has been less than 10 hours since the missed dose, take it as soon as you remember o If it has been more than 10 hours since the missed dose, skip the missed dose and take your dose at the next scheduled time. ? Do not double the dose of LAGEVRIO to make up for a missed dose.  What are the important possible side effects of LAGEVRIO? ? See, "What is the most important information I should know about LAGEVRIO?" ? Allergic Reactions. Allergic reactions can happen in people taking LAGEVRIO, even after only 1 dose. Stop taking LAGEVRIO and call your healthcare provider right away if you get any of the following symptoms of an allergic reaction: o hives o rapid  heartbeat o trouble swallowing or breathing o swelling of the mouth, lips, or face o throat tightness o hoarseness o skin rash The most common side effects of LAGEVRIO are: ? diarrhea ? nausea ? dizziness These are not all the possible side effects of LAGEVRIO. Not many people have taken LAGEVRIO. Serious and unexpected side effects may happen. This medicine is still being studied, so it is possible that all of the risks are not known at this time.  What other treatment choices are there?  Veklury (remdesivir) is FDA-approved as an intravenous (IV) infusion for the treatment of mildto-moderate TIRWE-31 in certain adults and children. Talk with your doctor to see if Marijean Heath is appropriate for you. Like LAGEVRIO, FDA may also allow for the emergency use of other medicines to treat people with COVID-19. Go to  LacrosseProperties.si for more information. It is your choice to be treated or not to be treated with LAGEVRIO. Should you decide not to take it, it will not change your standard medical care.  What if I am breastfeeding? Breastfeeding is not recommended during treatment with LAGEVRIO and for 4 days after the last dose of LAGEVRIO. If you are breastfeeding or plan to breastfeed, talk to your healthcare provider about your options and specific situation before taking LAGEVRIO.  How do I report side effects with LAGEVRIO? Contact your healthcare provider if you have any side effects that bother you or do not go away. Report side effects to FDA MedWatch at SmoothHits.hu or call 1-800-FDA-1088 (1- (380)695-1777).  How should I store [place]? ? Store LAGEVRIO capsules at room temperature between 95F to 51F (20C to 25C). ? Keep LAGEVRIO and all medicines out of the reach of children and pets. How can I learn more about COVID-19? ? Ask your healthcare provider. ? Visit SeekRooms.co.uk ? Contact your local or state public health department. ? Call [employer] at [phone] (toll free in the [place] ? Visit www.molnupiravir.com  What Is an Emergency Use Authorization (EUA)? The [place] FDA has made [place] available under an emergency access mechanism called an Emergency Use Authorization (EUA) The EUA is supported by a Presenter, broadcasting Health and Human Service (HHS) declaration that circumstances exist to justify emergency use of drugs and biological products during the COVID-19 pandemic. LAGEVRIO for the treatment of mild-to-moderate COVID-19 in adults with positive results of direct SARS-CoV-2 viral testing, who are at high risk for progression to severe COVID-19, including hospitalization or death, and for whom alternative COVID-19 treatment options approved or  authorized by FDA are not accessible or clinically appropriate, has not undergone the same type of review as an FDA-approved product. In issuing an EUA under the KDTOI-71 public health emergency, the FDA has determined, among other things, that based on the total amount of scientific evidence available including data from adequate and well-controlled clinical trials, if available, it is reasonable to believe that the product may be effective for diagnosing, treating, or preventing COVID-19, or a serious or life-threatening disease or condition caused by COVID-19; that the known and potential benefits of the product, when used to diagnose, treat, or prevent such disease or condition, outweigh the known and potential risks of such product; and that there are no adequate, approved, and available alternatives.  All of these criteria must be met to allow for the product to be used in the treatment of patients during the COVID-19 pandemic. The EUA for LAGEVRIO is in effect for the duration of the COVID-19 declaration justifying emergency use of  LAGEVRIO, unless terminated or revoked (after which LAGEVRIO may no longer be used under the EUA). For patent information: http://rogers.info/ Copyright  2021-2022 [place] [place], [place] and its affiliates. All rights reserved. [id number] Revised: March 2022

## 2022-04-08 ENCOUNTER — Other Ambulatory Visit: Payer: Self-pay | Admitting: Internal Medicine

## 2022-04-08 NOTE — Telephone Encounter (Signed)
Please refill as per office routine med refill policy (all routine meds to be refilled for 3 mo or monthly (per pt preference) up to one year from last visit, then month to month grace period for 3 mo, then further med refills will have to be denied) ? ?

## 2022-04-10 ENCOUNTER — Telehealth: Payer: Self-pay | Admitting: Internal Medicine

## 2022-04-10 DIAGNOSIS — Z125 Encounter for screening for malignant neoplasm of prostate: Secondary | ICD-10-CM

## 2022-04-10 DIAGNOSIS — E538 Deficiency of other specified B group vitamins: Secondary | ICD-10-CM

## 2022-04-10 DIAGNOSIS — E559 Vitamin D deficiency, unspecified: Secondary | ICD-10-CM

## 2022-04-10 DIAGNOSIS — E7800 Pure hypercholesterolemia, unspecified: Secondary | ICD-10-CM

## 2022-04-10 DIAGNOSIS — R739 Hyperglycemia, unspecified: Secondary | ICD-10-CM

## 2022-04-10 NOTE — Telephone Encounter (Signed)
Ok labs are ordered 

## 2022-04-10 NOTE — Telephone Encounter (Signed)
Pt has a CPE scheduled for 11/13 and a lab appt for 11/10.  Please enter lab orders.

## 2022-05-01 ENCOUNTER — Other Ambulatory Visit (INDEPENDENT_AMBULATORY_CARE_PROVIDER_SITE_OTHER): Payer: No Typology Code available for payment source

## 2022-05-01 DIAGNOSIS — R739 Hyperglycemia, unspecified: Secondary | ICD-10-CM

## 2022-05-01 DIAGNOSIS — E559 Vitamin D deficiency, unspecified: Secondary | ICD-10-CM

## 2022-05-01 DIAGNOSIS — E7800 Pure hypercholesterolemia, unspecified: Secondary | ICD-10-CM

## 2022-05-01 DIAGNOSIS — Z125 Encounter for screening for malignant neoplasm of prostate: Secondary | ICD-10-CM

## 2022-05-01 DIAGNOSIS — E538 Deficiency of other specified B group vitamins: Secondary | ICD-10-CM

## 2022-05-01 LAB — LIPID PANEL
Cholesterol: 141 mg/dL (ref 0–200)
HDL: 67.2 mg/dL (ref >39.00)
LDL Cholesterol: 61 mg/dL (ref 0–99)
NonHDL: 73.46
Total CHOL/HDL Ratio: 2
Triglycerides: 62 mg/dL (ref 0.0–149.0)
VLDL: 12.4 mg/dL (ref 0.0–40.0)

## 2022-05-01 LAB — CBC WITH DIFFERENTIAL/PLATELET
Basophils Absolute: 0 10*3/uL (ref 0.0–0.1)
Basophils Relative: 0.7 % (ref 0.0–3.0)
Eosinophils Absolute: 0.2 10*3/uL (ref 0.0–0.7)
Eosinophils Relative: 4 % (ref 0.0–5.0)
HCT: 41.3 % (ref 39.0–52.0)
Hemoglobin: 14.1 g/dL (ref 13.0–17.0)
Lymphocytes Relative: 32 % (ref 12.0–46.0)
Lymphs Abs: 2 10*3/uL (ref 0.7–4.0)
MCHC: 34.2 g/dL (ref 30.0–36.0)
MCV: 97.7 fl (ref 78.0–100.0)
Monocytes Absolute: 0.6 10*3/uL (ref 0.1–1.0)
Monocytes Relative: 9.8 % (ref 3.0–12.0)
Neutro Abs: 3.3 10*3/uL (ref 1.4–7.7)
Neutrophils Relative %: 53.5 % (ref 43.0–77.0)
Platelets: 217 10*3/uL (ref 150.0–400.0)
RBC: 4.23 Mil/uL (ref 4.22–5.81)
RDW: 12.9 % (ref 11.5–15.5)
WBC: 6.1 10*3/uL (ref 4.0–10.5)

## 2022-05-01 LAB — VITAMIN D 25 HYDROXY (VIT D DEFICIENCY, FRACTURES): VITD: 33.18 ng/mL (ref 30.00–100.00)

## 2022-05-01 LAB — HEPATIC FUNCTION PANEL
ALT: 22 U/L (ref 0–53)
AST: 26 U/L (ref 0–37)
Albumin: 4.3 g/dL (ref 3.5–5.2)
Alkaline Phosphatase: 46 U/L (ref 39–117)
Bilirubin, Direct: 0.2 mg/dL (ref 0.0–0.3)
Total Bilirubin: 0.8 mg/dL (ref 0.2–1.2)
Total Protein: 6.3 g/dL (ref 6.0–8.3)

## 2022-05-01 LAB — URINALYSIS, ROUTINE W REFLEX MICROSCOPIC
Hgb urine dipstick: NEGATIVE
Ketones, ur: 15 — AB
Leukocytes,Ua: NEGATIVE
Nitrite: NEGATIVE
Specific Gravity, Urine: >=1.03 — AB (ref 1.000–1.030)
Total Protein, Urine: NEGATIVE
Urine Glucose: NEGATIVE
Urobilinogen, UA: 0.2 (ref 0.0–1.0)
pH: 5.5 (ref 5.0–8.0)

## 2022-05-01 LAB — TSH: TSH: 1.2 u[IU]/mL (ref 0.35–5.50)

## 2022-05-01 LAB — HEMOGLOBIN A1C: Hgb A1c MFr Bld: 5.7 % (ref 4.6–6.5)

## 2022-05-01 LAB — BASIC METABOLIC PANEL
BUN: 14 mg/dL (ref 6–23)
CO2: 26 mEq/L (ref 19–32)
Calcium: 9.1 mg/dL (ref 8.4–10.5)
Chloride: 105 mEq/L (ref 96–112)
Creatinine, Ser: 0.79 mg/dL (ref 0.40–1.50)
GFR: 93.17 mL/min (ref >60.00)
Glucose, Bld: 93 mg/dL (ref 70–99)
Potassium: 3.9 mEq/L (ref 3.5–5.1)
Sodium: 140 mEq/L (ref 135–145)

## 2022-05-01 LAB — PSA: PSA: 5.13 ng/mL — ABNORMAL HIGH (ref 0.10–4.00)

## 2022-05-01 LAB — VITAMIN B12: Vitamin B-12: 433 pg/mL (ref 211–911)

## 2022-05-04 ENCOUNTER — Encounter: Payer: No Typology Code available for payment source | Admitting: Internal Medicine

## 2022-05-06 ENCOUNTER — Ambulatory Visit (INDEPENDENT_AMBULATORY_CARE_PROVIDER_SITE_OTHER): Payer: No Typology Code available for payment source | Admitting: Internal Medicine

## 2022-05-06 ENCOUNTER — Encounter: Payer: Self-pay | Admitting: Internal Medicine

## 2022-05-06 VITALS — BP 138/82 | HR 82 | Temp 98.6°F | Ht 70.0 in | Wt 178.0 lb

## 2022-05-06 DIAGNOSIS — E785 Hyperlipidemia, unspecified: Secondary | ICD-10-CM

## 2022-05-06 DIAGNOSIS — R9431 Abnormal electrocardiogram [ECG] [EKG]: Secondary | ICD-10-CM

## 2022-05-06 DIAGNOSIS — I10 Essential (primary) hypertension: Secondary | ICD-10-CM

## 2022-05-06 DIAGNOSIS — R739 Hyperglycemia, unspecified: Secondary | ICD-10-CM

## 2022-05-06 DIAGNOSIS — L989 Disorder of the skin and subcutaneous tissue, unspecified: Secondary | ICD-10-CM

## 2022-05-06 DIAGNOSIS — M1990 Unspecified osteoarthritis, unspecified site: Secondary | ICD-10-CM | POA: Diagnosis not present

## 2022-05-06 DIAGNOSIS — Z0001 Encounter for general adult medical examination with abnormal findings: Secondary | ICD-10-CM | POA: Diagnosis not present

## 2022-05-06 DIAGNOSIS — Z23 Encounter for immunization: Secondary | ICD-10-CM

## 2022-05-06 DIAGNOSIS — E559 Vitamin D deficiency, unspecified: Secondary | ICD-10-CM | POA: Diagnosis not present

## 2022-05-06 MED ORDER — TAMSULOSIN HCL 0.4 MG PO CAPS: ORAL_CAPSULE | ORAL | 3 refills | Status: DC

## 2022-05-06 MED ORDER — LOSARTAN POTASSIUM 50 MG PO TABS: 50.0000 mg | ORAL_TABLET | Freq: Every day | ORAL | 3 refills | Status: DC

## 2022-05-06 MED ORDER — DOXYCYCLINE HYCLATE 100 MG PO TABS: 100.0000 mg | ORAL_TABLET | Freq: Two times a day (BID) | ORAL | 0 refills | Status: DC

## 2022-05-06 MED ORDER — ATORVASTATIN CALCIUM 20 MG PO TABS: 20.0000 mg | ORAL_TABLET | Freq: Every day | ORAL | 3 refills | Status: DC

## 2022-05-06 MED ORDER — CELECOXIB 200 MG PO CAPS: 200.0000 mg | ORAL_CAPSULE | Freq: Two times a day (BID) | ORAL | 3 refills | Status: DC

## 2022-05-06 NOTE — Assessment & Plan Note (Signed)
Last vitamin D Lab Results  Component Value Date   VD25OH 33.18 05/01/2022   Low, to start oral replacement

## 2022-05-06 NOTE — Assessment & Plan Note (Addendum)
Lab Results  Component Value Date   LDLCALC 61 05/01/2022   Stable, pt to continue current statin lipitor 20 mg qd, also for cardiac CT score testing

## 2022-05-06 NOTE — Assessment & Plan Note (Signed)
Lab Results  Component Value Date   HGBA1C 5.7 05/01/2022   Stable, pt to continue current medical treatment  - diet, wt control, excercise

## 2022-05-06 NOTE — Assessment & Plan Note (Signed)
BP Readings from Last 3 Encounters:  05/06/22 138/82  05/02/21 138/80  02/21/21 122/78   Stable, pt to continue medical treatment losartan 50 mg qd

## 2022-05-06 NOTE — Assessment & Plan Note (Signed)
Left mid lower eyelid approx 10 mm red, tender swelling - ? Abscess vs chalazion - for doxycycline 100 bid course, and consider optho referral if not improved in 1 wk

## 2022-05-06 NOTE — Progress Notes (Signed)
Patient ID: Peter Leblanc, male   DOB: [birth year], 66 y.o.   MRN: [id number]         Chief Complaint:: wellness exam and left lower eyelid infection possible stye, arthritis and low vit d       HPI:  Peter Leblanc is a 66 y.o. male here for wellness exam; pt for prevnar 20 today, but thinks he had the flu shot already at the pharmacy, but will check on this himself.  O/w up to date                        Also willing to have Cardiac CT score.  Also generalized arthritis no longer well controlled on mobic despite good compliance, willing to change and add tylenol.  Not taking Vit D.   Also has a left lower eyelid area of red, tender, swelling but no fever or vision change or eye pain.     Wt Readings from Last 3 Encounters:  05/06/22 178 lb (80.7 kg)  05/02/21 181 lb 3.2 oz (82.2 kg)  02/21/21 180 lb 6.4 oz (81.8 kg)   BP Readings from Last 3 Encounters:  05/06/22 138/82  05/02/21 138/80  02/21/21 122/78   Immunization History  Administered Date(s) Administered   Influenza Inj Mdck Quad Pf 03/31/2018   Influenza Whole 06/25/1997   Influenza,inj,Quad PF,6+ Mos 02/14/2014, 02/19/2019, 05/02/2021   Influenza-Unspecified 03/08/2015, 04/02/2020   PFIZER(Purple Top)SARS-COV-2 Vaccination 09/09/2019, 09/23/2019, 06/27/2020   PNEUMOCOCCAL CONJUGATE-20 05/06/2022   Td 11/29/2009   Tdap 05/02/2021   Zoster Recombinat (Shingrix) 05/28/2021, 10/08/2021  There are no preventive care reminders to display for this patient.    Past Medical History:  Diagnosis Date   ALLERGIC RHINITIS 02/15/2007   Allergy    Anxiety 08/23/2011   BPH (benign prostatic hypertrophy) with urinary obstruction 11/16/2012   CONJUNCTIVITIS, INFECTIOUS 09/04/2008   ERECTILE DYSFUNCTION 09/26/2008   Genital herpes    HTN (hypertension) 12/06/2015   HYPERLIPIDEMIA 11/08/2007   KNEE PAIN, BILATERAL 11/29/2009   PSA, INCREASED 11/08/2007   SKIN LESION 11/29/2009   Slowing of urinary stream 09/04/2008   TIA (transient ischemic  attack) 12/06/2015   VULVAR ABSCESS 11/27/2009   Past Surgical History:  Procedure Laterality Date   COLONOSCOPY  12/02/2006   w/Brodie    GANGLION CYST EXCISION     NASAL SINUS SURGERY  1997   PROSTATE BIOPSY     TONSILLECTOMY     VASECTOMY      reports that he has never smoked. He has never used smokeless tobacco. He reports current alcohol use of about 14.0 standard drinks of alcohol per week. He reports that he does not use drugs. He was adopted. Family history is unknown by patient. No Known Allergies Current Outpatient Medications on File Prior to Visit  Medication Sig Dispense Refill   aspirin EC 81 MG tablet Take 81 mg by mouth daily.     benzonatate (TESSALON PERLES) 100 MG capsule 1-2 capsules up to twice daily as needed for cough. 30 capsule 0   CIALIS 5 MG tablet Take 1 tablet (5 mg total) by mouth daily. 10 tablet 6   finasteride (PROSCAR) 5 MG tablet Take 5 mg by mouth daily.     TURMERIC PO Take by mouth.     No current facility-administered medications on file prior to visit.        ROS:  All others reviewed and negative.  Objective  PE:  BP 138/82 (BP Location: Right Arm, Patient Position: Sitting, Cuff Size: Large)   Pulse 82   Temp 98.6 F (37 C) (Oral)   Ht '5\' 10"'$  (1.778 m)   Wt 178 lb (80.7 kg)   SpO2 92%   BMI 25.54 kg/m                 Constitutional: Pt appears in NAD               HENT: Head: NCAT.                Right Ear: External ear normal.                 Left Ear: External ear normal.                Eyes: . Pupils are equal, round, and reactive to light. Conjunctivae and EOM are normal, but left lower eyelid with approx 10-11 mm mid lesion red, tender, swelling without drainage               Nose: without d/c or deformity               Neck: Neck supple. Gross normal ROM               Cardiovascular: Normal rate and regular rhythm.                 Pulmonary/Chest: Effort normal and breath sounds without rales or wheezing.                 Abd:  Soft, NT, ND, + BS, no organomegaly               Neurological: Pt is alert. At baseline orientation, motor grossly intact               Skin: Skin is warm. No rashes, no other new lesions, LE edema - none               Psychiatric: Pt behavior is normal without agitation   Micro: none  Cardiac tracings I have personally interpreted today:  none  Pertinent Radiological findings (summarize): none   Lab Results  Component Value Date   WBC 6.1 05/01/2022   HGB 14.1 05/01/2022   HCT 41.3 05/01/2022   PLT 217.0 05/01/2022   GLUCOSE 93 05/01/2022   CHOL 141 05/01/2022   TRIG 62.0 05/01/2022   HDL 67.20 05/01/2022   LDLDIRECT 86.0 12/04/2020   LDLCALC 61 05/01/2022   ALT 22 05/01/2022   AST 26 05/01/2022   NA 140 05/01/2022   K 3.9 05/01/2022   CL 105 05/01/2022   CREATININE 0.79 05/01/2022   BUN 14 05/01/2022   CO2 26 05/01/2022   TSH 1.20 05/01/2022   PSA 5.13 (H) 05/01/2022   HGBA1C 5.7 05/01/2022   Assessment/Plan:  Peter Leblanc is a 66 y.o. White or Caucasian [1] male with  has a past medical history of ALLERGIC RHINITIS (02/15/2007), Allergy, Anxiety (08/23/2011), BPH (benign prostatic hypertrophy) with urinary obstruction (11/16/2012), CONJUNCTIVITIS, INFECTIOUS (09/04/2008), ERECTILE DYSFUNCTION (09/26/2008), Genital herpes, HTN (hypertension) (12/06/2015), HYPERLIPIDEMIA (11/08/2007), KNEE PAIN, BILATERAL (11/29/2009), PSA, INCREASED (11/08/2007), SKIN LESION (11/29/2009), Slowing of urinary stream (09/04/2008), TIA (transient ischemic attack) (12/06/2015), and VULVAR ABSCESS (11/27/2009).  Encounter for well adult exam with abnormal findings Age and sex appropriate education and counseling updated with regular exercise and diet Referrals for preventative services - none needed Immunizations addressed - for prevnar 20  today, pt to check on flu shot Smoking counseling  - none needed Evidence for depression or other mood disorder - none significant Most recent labs reviewed. I  have personally reviewed and have noted: 1) the patient's medical and social history 2) The patient's current medications and supplements 3) The patient's height, weight, and BMI have been recorded in the chart   Vitamin D deficiency Last vitamin D Lab Results  Component Value Date   VD25OH 33.18 05/01/2022   Low, to start oral replacement   Skin lesion Left mid lower eyelid approx 10 mm red, tender swelling - ? Abscess vs chalazion - for doxycycline 100 bid course, and consider optho referral if not improved in 1 wk  Hyperglycemia Lab Results  Component Value Date   HGBA1C 5.7 05/01/2022   Stable, pt to continue current medical treatment  - diet, wt control, excercise   HTN (hypertension) BP Readings from Last 3 Encounters:  05/06/22 138/82  05/02/21 138/80  02/21/21 122/78   Stable, pt to continue medical treatment losartan 50 mg qd   HLD (hyperlipidemia) Lab Results  Component Value Date   LDLCALC 61 05/01/2022   Stable, pt to continue current statin lipitor 20 mg qd, also for cardiac CT score testing   Arthritis To bilateral hands, lower back, knees - for tylenol up to 3000 mg qd prn, change mobic 15 mg to celebrex 200 bid prn, and OTC Voltaren gel prn as well  Followup: Return in about 1 year (around 05/07/2023).  Cathlean Cower, MD 05/06/2022 12:52 PM [facility]

## 2022-05-06 NOTE — Assessment & Plan Note (Signed)
Age and sex appropriate education and counseling updated with regular exercise and diet Referrals for preventative services - none needed Immunizations addressed - for prevnar 20 today, pt to check on flu shot Smoking counseling  - none needed Evidence for depression or other mood disorder - none significant Most recent labs reviewed. I have personally reviewed and have noted: 1) the patient's medical and social history 2) The patient's current medications and supplements 3) The patient's height, weight, and BMI have been recorded in the chart

## 2022-05-06 NOTE — Assessment & Plan Note (Signed)
To bilateral hands, lower back, knees - for tylenol up to 3000 mg qd prn, change mobic 15 mg to celebrex 200 bid prn, and OTC Voltaren gel prn as well

## 2022-05-06 NOTE — Patient Instructions (Addendum)
You had the Prevnar 20 pneumonia shot today  Please take all new medication as prescribed  - the antibiotic, and call if not improved in 1 wk for eye doctor referral if needed  Ok to change the Mobic to the Celebrex 200 mg twice per day as needed  You can also take tylenol as needed up to 3000 mg per day  Please take OTC Vitamin D3 at 2000 units per day, indefinitely  Please continue all other medications as before, and refills have been done if requested.  Please have the pharmacy call with any other refills you may need.  Please continue your efforts at being more active, low cholesterol diet, and weight control.  You are otherwise up to date with prevention measures today.  Please keep your appointments with your specialists as you may have planned  You will be contacted regarding the referral for: Cardiac CT score  Please make an Appointment to return for your 1 year visit, or sooner if needed, with Lab testing by Appointment as well, to be done about 3-5 days before at the [facility] (so this is for TWO appointments - please see the scheduling desk as you leave)

## 2023-01-29 ENCOUNTER — Ambulatory Visit: Payer: No Typology Code available for payment source | Admitting: Internal Medicine

## 2023-01-29 VITALS — BP 122/82 | HR 77 | Temp 98.3°F | Ht 70.0 in | Wt 172.8 lb

## 2023-01-29 DIAGNOSIS — R9431 Abnormal electrocardiogram [ECG] [EKG]: Secondary | ICD-10-CM

## 2023-01-29 DIAGNOSIS — E785 Hyperlipidemia, unspecified: Secondary | ICD-10-CM

## 2023-01-29 DIAGNOSIS — E538 Deficiency of other specified B group vitamins: Secondary | ICD-10-CM

## 2023-01-29 DIAGNOSIS — Z125 Encounter for screening for malignant neoplasm of prostate: Secondary | ICD-10-CM

## 2023-01-29 DIAGNOSIS — M19072 Primary osteoarthritis, left ankle and foot: Secondary | ICD-10-CM

## 2023-01-29 DIAGNOSIS — Z0001 Encounter for general adult medical examination with abnormal findings: Secondary | ICD-10-CM | POA: Diagnosis not present

## 2023-01-29 DIAGNOSIS — I10 Essential (primary) hypertension: Secondary | ICD-10-CM | POA: Diagnosis not present

## 2023-01-29 DIAGNOSIS — R739 Hyperglycemia, unspecified: Secondary | ICD-10-CM

## 2023-01-29 DIAGNOSIS — M79675 Pain in left toe(s): Secondary | ICD-10-CM

## 2023-01-29 DIAGNOSIS — E559 Vitamin D deficiency, unspecified: Secondary | ICD-10-CM

## 2023-01-29 LAB — HEPATIC FUNCTION PANEL
ALT: 16 U/L (ref 0–53)
AST: 22 U/L (ref 0–37)
Albumin: 4.4 g/dL (ref 3.5–5.2)
Alkaline Phosphatase: 41 U/L (ref 39–117)
Bilirubin, Direct: 0.2 mg/dL (ref 0.0–0.3)
Total Bilirubin: 0.8 mg/dL (ref 0.2–1.2)
Total Protein: 6.5 g/dL (ref 6.0–8.3)

## 2023-01-29 LAB — BASIC METABOLIC PANEL
BUN: 15 mg/dL (ref 6–23)
CO2: 27 mEq/L (ref 19–32)
Calcium: 9.4 mg/dL (ref 8.4–10.5)
Chloride: 102 mEq/L (ref 96–112)
Creatinine, Ser: 0.82 mg/dL (ref 0.40–1.50)
GFR: 91.65 mL/min (ref >60.00)
Glucose, Bld: 95 mg/dL (ref 70–99)
Potassium: 4.4 mEq/L (ref 3.5–5.1)
Sodium: 136 mEq/L (ref 135–145)

## 2023-01-29 LAB — URINALYSIS, ROUTINE W REFLEX MICROSCOPIC
Bilirubin Urine: NEGATIVE
Hgb urine dipstick: NEGATIVE
Ketones, ur: NEGATIVE
Leukocytes,Ua: NEGATIVE
Nitrite: NEGATIVE
RBC / HPF: NONE SEEN (ref 0–?)
Specific Gravity, Urine: 1.015 (ref 1.000–1.030)
Total Protein, Urine: NEGATIVE
Urine Glucose: NEGATIVE
Urobilinogen, UA: 1 (ref 0.0–1.0)
pH: 7 (ref 5.0–8.0)

## 2023-01-29 LAB — VITAMIN B12: Vitamin B-12: 395 pg/mL (ref 211–911)

## 2023-01-29 LAB — CBC WITH DIFFERENTIAL/PLATELET
Basophils Absolute: 0 10*3/uL (ref 0.0–0.1)
Basophils Relative: 0.6 % (ref 0.0–3.0)
Eosinophils Absolute: 0.1 10*3/uL (ref 0.0–0.7)
Eosinophils Relative: 2.1 % (ref 0.0–5.0)
HCT: 43.2 % (ref 39.0–52.0)
Hemoglobin: 14.4 g/dL (ref 13.0–17.0)
Lymphocytes Relative: 26.6 % (ref 12.0–46.0)
Lymphs Abs: 1.7 10*3/uL (ref 0.7–4.0)
MCHC: 33.4 g/dL (ref 30.0–36.0)
MCV: 99.6 fl (ref 78.0–100.0)
Monocytes Absolute: 0.6 10*3/uL (ref 0.1–1.0)
Monocytes Relative: 9.4 % (ref 3.0–12.0)
Neutro Abs: 3.9 10*3/uL (ref 1.4–7.7)
Neutrophils Relative %: 61.3 % (ref 43.0–77.0)
Platelets: 196 10*3/uL (ref 150.0–400.0)
RBC: 4.34 Mil/uL (ref 4.22–5.81)
RDW: 12.8 % (ref 11.5–15.5)
WBC: 6.4 10*3/uL (ref 4.0–10.5)

## 2023-01-29 LAB — MICROALBUMIN / CREATININE URINE RATIO
Creatinine,U: 85.1 mg/dL
Microalb Creat Ratio: 0.8 mg/g (ref 0.0–30.0)
Microalb, Ur: <0.7 mg/dL (ref 0.0–1.9)

## 2023-01-29 LAB — LIPID PANEL
Cholesterol: 144 mg/dL (ref 0–200)
HDL: 61.3 mg/dL (ref >39.00)
LDL Cholesterol: 69 mg/dL (ref 0–99)
NonHDL: 83.15
Total CHOL/HDL Ratio: 2
Triglycerides: 70 mg/dL (ref 0.0–149.0)
VLDL: 14 mg/dL (ref 0.0–40.0)

## 2023-01-29 LAB — PSA: PSA: 3.32 ng/mL (ref 0.10–4.00)

## 2023-01-29 LAB — VITAMIN D 25 HYDROXY (VIT D DEFICIENCY, FRACTURES): VITD: 37.87 ng/mL (ref 30.00–100.00)

## 2023-01-29 LAB — TSH: TSH: 1.18 u[IU]/mL (ref 0.35–5.50)

## 2023-01-29 LAB — HEMOGLOBIN A1C: Hgb A1c MFr Bld: 5.5 % (ref 4.6–6.5)

## 2023-01-29 NOTE — Progress Notes (Unsigned)
Patient ID: Peter Leblanc, male   DOB: 22-Sep-1955, 67 y.o.   MRN: [id number]         Chief Complaint:: wellness exam and left first MTP pain, hld, low vit d, htn, hyperglycemia       HPI:  Peter Leblanc is a 67 y.o. male here for wellness exam; declines covid booster, flu shot, ow up to date                        Also Pt denies chest pain, increased sob or doe, wheezing, orthopnea, PND, increased LE swelling, palpitations, dizziness or syncope.   Pt denies polydipsia, polyuria, or new focal neuro s/s.    Pt denies fever, wt loss, night sweats, loss of appetite, or other constitutional symptoms  Does also have significant pain after about an hour of standing that makes it really difficult to even walk and stand at work, getting very difficult, ongoing for over 3 mo.  He is willing for cardiac ct score.  Pt will have FMLA to fill out - will need up to 2 days per wk, 8 hrs per day   Wt Readings from Last 3 Encounters:  01/29/23 172 lb 12.8 oz (78.4 kg)  05/06/22 178 lb (80.7 kg)  05/02/21 181 lb 3.2 oz (82.2 kg)   BP Readings from Last 3 Encounters:  01/29/23 122/82  05/06/22 138/82  05/02/21 138/80   Immunization History  Administered Date(s) Administered   Influenza Inj Mdck Quad Pf 03/31/2018   Influenza Whole 06/25/1997   Influenza,inj,Quad PF,6+ Mos 02/14/2014, 02/19/2019, 05/02/2021   Influenza-Unspecified 03/08/2015, 04/02/2020   PFIZER(Purple Top)SARS-COV-2 Vaccination 09/09/2019, 09/23/2019, 06/27/2020   PNEUMOCOCCAL CONJUGATE-20 05/06/2022   Td 11/29/2009   Tdap 05/02/2021   Zoster Recombinant(Shingrix) 05/28/2021, 10/08/2021  There are no preventive care reminders to display for this patient.    Past Medical History:  Diagnosis Date   ALLERGIC RHINITIS 02/15/2007   Allergy    Anxiety 08/23/2011   BPH (benign prostatic hypertrophy) with urinary obstruction 11/16/2012   CONJUNCTIVITIS, INFECTIOUS 09/04/2008   ERECTILE DYSFUNCTION 09/26/2008   Genital herpes    HTN  (hypertension) 12/06/2015   HYPERLIPIDEMIA 11/08/2007   KNEE PAIN, BILATERAL 11/29/2009   PSA, INCREASED 11/08/2007   SKIN LESION 11/29/2009   Slowing of urinary stream 09/04/2008   TIA (transient ischemic attack) 12/06/2015   VULVAR ABSCESS 11/27/2009   Past Surgical History:  Procedure Laterality Date   COLONOSCOPY  12/02/2006   w/Brodie    GANGLION CYST EXCISION     NASAL SINUS SURGERY  1997   PROSTATE BIOPSY     TONSILLECTOMY     VASECTOMY      reports that he has never smoked. He has never used smokeless tobacco. He reports current alcohol use of about 14.0 standard drinks of alcohol per week. He reports that he does not use drugs. He was adopted. Family history is unknown by patient. No Known Allergies Current Outpatient Medications on File Prior to Visit  Medication Sig Dispense Refill   aspirin EC 81 MG tablet Take 81 mg by mouth daily.     atorvastatin (LIPITOR) 20 MG tablet Take 1 tablet (20 mg total) by mouth daily. 90 tablet 3   benzonatate (TESSALON PERLES) 100 MG capsule 1-2 capsules up to twice daily as needed for cough. 30 capsule 0   celecoxib (CELEBREX) 200 MG capsule Take 1 capsule (200 mg total) by mouth 2 (two) times daily. 180 capsule 3  CIALIS 5 MG tablet Take 1 tablet (5 mg total) by mouth daily. 10 tablet 6   doxycycline (VIBRA-TABS) 100 MG tablet Take 1 tablet (100 mg total) by mouth 2 (two) times daily. 20 tablet 0   finasteride (PROSCAR) 5 MG tablet Take 5 mg by mouth daily.     losartan (COZAAR) 50 MG tablet Take 1 tablet (50 mg total) by mouth daily. 90 tablet 3   tamsulosin (FLOMAX) 0.4 MG CAPS capsule TAKE 1 CAPSULE BY MOUTH TWICE A DAY 180 capsule 3   TURMERIC PO Take by mouth.     No current facility-administered medications on file prior to visit.        ROS:  All others reviewed and negative.  Objective        PE:  BP 122/82 (BP Location: Left Arm, Patient Position: Sitting, Cuff Size: Normal)   Pulse 77   Temp 98.3 F (36.8 C) (Oral)   Ht 5'  10" (1.778 m)   Wt 172 lb 12.8 oz (78.4 kg)   SpO2 96%   BMI 24.79 kg/m                 Constitutional: Pt appears in NAD               HENT: Head: NCAT.                Right Ear: External ear normal.                 Left Ear: External ear normal.                Eyes: . Pupils are equal, round, and reactive to light. Conjunctivae and EOM are normal               Nose: without d/c or deformity               Neck: Neck supple. Gross normal ROM               Cardiovascular: Normal rate and regular rhythm.                 Pulmonary/Chest: Effort normal and breath sounds without rales or wheezing.                Abd:  Soft, NT, ND, + BS, no organomegaly               Neurological: Pt is alert. At baseline orientation, motor grossly intact               Skin: Skin is warm. No rashes, no other new lesions, LE edema - none;  left first MTP with large bony degenerative change               Psychiatric: Pt behavior is normal without agitation   Micro: none  Cardiac tracings I have personally interpreted today:  none  Pertinent Radiological findings (summarize): none   Lab Results  Component Value Date   WBC 6.4 01/29/2023   HGB 14.4 01/29/2023   HCT 43.2 01/29/2023   PLT 196.0 01/29/2023   GLUCOSE 95 01/29/2023   CHOL 144 01/29/2023   TRIG 70.0 01/29/2023   HDL 61.30 01/29/2023   LDLDIRECT 86.0 12/04/2020   LDLCALC 69 01/29/2023   ALT 16 01/29/2023   AST 22 01/29/2023   NA 136 01/29/2023   K 4.4 01/29/2023   CL 102 01/29/2023   CREATININE 0.82 01/29/2023   BUN 15 01/29/2023  CO2 27 01/29/2023   TSH 1.18 01/29/2023   PSA 3.32 01/29/2023   HGBA1C 5.5 01/29/2023   MICROALBUR <0.7 01/29/2023   Assessment/Plan:  Peter Leblanc is a 67 y.o. White or Caucasian [1] male with  has a past medical history of ALLERGIC RHINITIS (02/15/2007), Allergy, Anxiety (08/23/2011), BPH (benign prostatic hypertrophy) with urinary obstruction (11/16/2012), CONJUNCTIVITIS, INFECTIOUS (09/04/2008),  ERECTILE DYSFUNCTION (09/26/2008), Genital herpes, HTN (hypertension) (12/06/2015), HYPERLIPIDEMIA (11/08/2007), KNEE PAIN, BILATERAL (11/29/2009), PSA, INCREASED (11/08/2007), SKIN LESION (11/29/2009), Slowing of urinary stream (09/04/2008), TIA (transient ischemic attack) (12/06/2015), and VULVAR ABSCESS (11/27/2009).  Encounter for well adult exam with abnormal findings Age and sex appropriate education and counseling updated with regular exercise and diet Referrals for preventative services - none needed Immunizations addressed - declines covid booster, for flu shot at pharmacy Smoking counseling  - none needed Evidence for depression or other mood disorder - none significant Most recent labs reviewed. I have personally reviewed and have noted: 1) the patient's medical and social history 2) The patient's current medications and supplements 3) The patient's height, weight, and BMI have been recorded in the chart   HLD (hyperlipidemia) Lab Results  Component Value Date   LDLCALC 69 01/29/2023   Stable, pt to continue current statin lipitor 20 every day, ok for cardiac CT score   HTN (hypertension) BP Readings from Last 3 Encounters:  01/29/23 122/82  05/06/22 138/82  05/02/21 138/80   Stable, pt to continue medical treatment losartan 50 qd   Hyperglycemia Lab Results  Component Value Date   HGBA1C 5.5 01/29/2023   Stable, pt to continue current medical treatment  - diet, wt control   Vitamin D deficiency Last vitamin D Lab Results  Component Value Date   VD25OH 37.87 01/29/2023   Low, to start oral replacement   Arthritis of foot, 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  Oliver Barre, MD 01/30/2023 7:38 PM [facility]

## 2023-01-29 NOTE — Progress Notes (Signed)
The test results show that your current treatment is OK, as the tests are stable.  Please continue the same plan.  There is no other need for change of treatment or further evaluation based on these results, at this time.  thanks 

## 2023-01-29 NOTE — Patient Instructions (Signed)
Please continue all other medications as before, and refills have been done if requested.  Please have the pharmacy call with any other refills you may need.  Please continue your efforts at being more active, low cholesterol diet, and weight control.  You are otherwise up to date with prevention measures today.  Please keep your appointments with your specialists as you may have planned  You will be contacted regarding the referral for: Ortho for the toe joint pain, and Cardiac CT score  Please go to the LAB at the blood drawing area for the tests to be done  You will be contacted by phone if any changes need to be made immediately.  Otherwise, you will receive a letter about your results with an explanation, but please check with MyChart first.  Please remember to sign up for MyChart if you have not done so, as this will be important to you in the future with finding out test results, communicating by private email, and scheduling acute appointments online when needed.  Please make an Appointment to return for your 1 year visit, or sooner if needed

## 2023-01-30 ENCOUNTER — Encounter: Payer: Self-pay | Admitting: Internal Medicine

## 2023-01-30 NOTE — Assessment & Plan Note (Signed)
BP Readings from Last 3 Encounters:  01/29/23 122/82  05/06/22 138/82  05/02/21 138/80   Stable, pt to continue medical treatment losartan 50 qd

## 2023-01-30 NOTE — Assessment & Plan Note (Signed)
Age and sex appropriate education and counseling updated with regular exercise and diet Referrals for preventative services - none needed Immunizations addressed - declines covid booster, for flu shot at pharmacy Smoking counseling  - none needed Evidence for depression or other mood disorder - none significant Most recent labs reviewed. I have personally reviewed and have noted: 1) the patient's medical and social history 2) The patient's current medications and supplements 3) The patient's height, weight, and BMI have been recorded in the chart

## 2023-01-30 NOTE — Assessment & Plan Note (Signed)
Last vitamin D Lab Results  Component Value Date   VD25OH 37.87 01/29/2023   Low, to start oral replacement

## 2023-01-30 NOTE — Assessment & Plan Note (Signed)
Lab Results  Component Value Date   HGBA1C 5.5 01/29/2023   Stable, pt to continue current medical treatment  - diet, wt control

## 2023-01-30 NOTE — Assessment & Plan Note (Signed)
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

## 2023-01-30 NOTE — Assessment & Plan Note (Addendum)
Lab Results  Component Value Date   LDLCALC 69 01/29/2023   Stable, pt to continue current statin lipitor 20 every day, ok for cardiac CT score

## 2023-02-04 ENCOUNTER — Ambulatory Visit (HOSPITAL_COMMUNITY)
Admission: RE | Admit: 2023-02-04 | Discharge: 2023-02-04 | Disposition: A | Discharge status: Home / Self Care | Payer: No Typology Code available for payment source | Source: Ambulatory Visit | Attending: Internal Medicine | Admitting: Internal Medicine

## 2023-02-04 DIAGNOSIS — E785 Hyperlipidemia, unspecified: Secondary | ICD-10-CM | POA: Insufficient documentation

## 2023-02-04 DIAGNOSIS — R9431 Abnormal electrocardiogram [ECG] [EKG]: Secondary | ICD-10-CM | POA: Insufficient documentation

## 2023-02-04 DIAGNOSIS — I10 Essential (primary) hypertension: Secondary | ICD-10-CM | POA: Insufficient documentation

## 2023-02-05 ENCOUNTER — Other Ambulatory Visit: Payer: Self-pay | Admitting: Internal Medicine

## 2023-02-05 DIAGNOSIS — R931 Abnormal findings on diagnostic imaging of heart and coronary circulation: Secondary | ICD-10-CM

## 2023-02-08 ENCOUNTER — Telehealth: Payer: Self-pay | Admitting: Internal Medicine

## 2023-02-08 NOTE — Telephone Encounter (Signed)
FMLA forms have been placed up front

## 2023-05-10 ENCOUNTER — Encounter: Payer: Self-pay | Admitting: Internal Medicine

## 2023-05-10 ENCOUNTER — Ambulatory Visit (INDEPENDENT_AMBULATORY_CARE_PROVIDER_SITE_OTHER): Payer: No Typology Code available for payment source | Admitting: Internal Medicine

## 2023-05-10 VITALS — BP 122/76 | HR 75 | Temp 98.9°F | Ht 70.0 in | Wt 176.0 lb

## 2023-05-10 DIAGNOSIS — I10 Essential (primary) hypertension: Secondary | ICD-10-CM | POA: Diagnosis not present

## 2023-05-10 DIAGNOSIS — R739 Hyperglycemia, unspecified: Secondary | ICD-10-CM | POA: Diagnosis not present

## 2023-05-10 DIAGNOSIS — E538 Deficiency of other specified B group vitamins: Secondary | ICD-10-CM

## 2023-05-10 DIAGNOSIS — Z0001 Encounter for general adult medical examination with abnormal findings: Secondary | ICD-10-CM | POA: Diagnosis not present

## 2023-05-10 DIAGNOSIS — Z125 Encounter for screening for malignant neoplasm of prostate: Secondary | ICD-10-CM

## 2023-05-10 DIAGNOSIS — E785 Hyperlipidemia, unspecified: Secondary | ICD-10-CM

## 2023-05-10 DIAGNOSIS — J069 Acute upper respiratory infection, unspecified: Secondary | ICD-10-CM | POA: Diagnosis not present

## 2023-05-10 DIAGNOSIS — Z23 Encounter for immunization: Secondary | ICD-10-CM | POA: Diagnosis not present

## 2023-05-10 DIAGNOSIS — E559 Vitamin D deficiency, unspecified: Secondary | ICD-10-CM

## 2023-05-10 MED ORDER — AZITHROMYCIN 250 MG PO TABS: ORAL_TABLET | ORAL | 1 refills | Status: AC

## 2023-05-10 MED ORDER — HYDROCODONE BIT-HOMATROP MBR 5-1.5 MG/5ML PO SOLN: 5.0000 mL | Freq: Four times a day (QID) | ORAL | 0 refills | Status: DC | PRN

## 2023-05-10 NOTE — Assessment & Plan Note (Signed)
BP Readings from Last 3 Encounters:  05/10/23 122/76  01/29/23 122/82  05/06/22 138/82   Stable, pt to continue medical treatment losartan 50 mg qd

## 2023-05-10 NOTE — Assessment & Plan Note (Signed)
Mild to mod, for antibx course zpack and cough med prn,,  to f/u any worsening symptoms or concerns

## 2023-05-10 NOTE — Patient Instructions (Addendum)
Please take all new medication as prescribed - the antibiotic and cough medicine  Please continue all other medications as before, and refills have been done if requested.  Please have the pharmacy call with any other refills you may need.  Please continue your efforts at being more active, low cholesterol diet, and weight control.  You are otherwise up to date with prevention measures today.  Please keep your appointments with your specialists as you may have planned  Please make an Appointment to return for your 1 year visit, or sooner if needed, with Lab testing by Appointment as well, to be done about 3-5 days before at the [facility] (so this is for TWO appointments - please see the scheduling desk as you leave)

## 2023-05-10 NOTE — Assessment & Plan Note (Signed)
Last vitamin D Lab Results  Component Value Date   VD25OH 37.87 01/29/2023   Low, to start oral replacement

## 2023-05-10 NOTE — Assessment & Plan Note (Signed)
Lab Results  Component Value Date   LDLCALC 69 01/29/2023   Stable, pt to continue current statin lipitor 20 qd

## 2023-05-10 NOTE — Assessment & Plan Note (Signed)
Lab Results  Component Value Date   HGBA1C 5.5 01/29/2023   Stable, pt to continue current medical treatment  - diet, wt control

## 2023-05-10 NOTE — Progress Notes (Signed)
Patient ID: Peter Leblanc, male   DOB: May 04, 1956, 67 y.o.   MRN: [id number]         Chief Complaint:: wellness exam and low vit d, hyperglycemia, hld, htn, acute upper resp infection       HPI:  Peter Leblanc is a 67 y.o. male here for wellness exam; dclines covid booster, o/w up to date                        Also  Here with 2-3 days acute onset fever, facial pain, pressure, headache, general weakness and malaise, and greenish d/c, with mild ST and cough, but pt denies chest pain, wheezing, increased sob or doe, orthopnea, PND, increased LE swelling, palpitations, dizziness or syncope.   Pt denies polydipsia, polyuria, or new focal neuro s/s.    Pt denies wt loss, night sweats, loss of appetite, or other constitutional symptoms  Does also have persistent gradually worsening chronic lbp > 6 mo worse to walk and stand up, with mild intermittent right sciatica (none currently).     Wt Readings from Last 3 Encounters:  05/10/23 176 lb (79.8 kg)  01/29/23 172 lb 12.8 oz (78.4 kg)  05/06/22 178 lb (80.7 kg)   BP Readings from Last 3 Encounters:  05/10/23 122/76  01/29/23 122/82  05/06/22 138/82   Immunization History  Administered Date(s) Administered   Fluad Trivalent(High Dose 65+) 05/10/2023   Influenza Inj Mdck Quad Pf 03/31/2018   Influenza Whole 06/25/1997   Influenza,inj,Quad PF,6+ Mos 02/14/2014, 02/19/2019, 05/02/2021   Influenza-Unspecified 03/08/2015, 04/02/2020   PFIZER(Purple Top)SARS-COV-2 Vaccination 09/09/2019, 09/23/2019, 06/27/2020   PNEUMOCOCCAL CONJUGATE-20 05/06/2022   Td 11/29/2009   Tdap 05/02/2021   Zoster Recombinant(Shingrix) 05/28/2021, 10/08/2021   Health Maintenance Due  Topic Date Due   COVID-19 Vaccine (4 - 2023-24 season) 02/21/2023      Past Medical History:  Diagnosis Date   ALLERGIC RHINITIS 02/15/2007   Allergy    Anxiety 08/23/2011   BPH (benign prostatic hypertrophy) with urinary obstruction 11/16/2012   CONJUNCTIVITIS, INFECTIOUS 09/04/2008    ERECTILE DYSFUNCTION 09/26/2008   Genital herpes    HTN (hypertension) 12/06/2015   HYPERLIPIDEMIA 11/08/2007   KNEE PAIN, BILATERAL 11/29/2009   PSA, INCREASED 11/08/2007   SKIN LESION 11/29/2009   Slowing of urinary stream 09/04/2008   TIA (transient ischemic attack) 12/06/2015   VULVAR ABSCESS 11/27/2009   Past Surgical History:  Procedure Laterality Date   COLONOSCOPY  12/02/2006   w/Brodie    GANGLION CYST EXCISION     NASAL SINUS SURGERY  1997   PROSTATE BIOPSY     TONSILLECTOMY     VASECTOMY      reports that he has never smoked. He has never used smokeless tobacco. He reports current alcohol use of about 14.0 standard drinks of alcohol per week. He reports that he does not use drugs. He was adopted. Family history is unknown by patient. No Known Allergies Current Outpatient Medications on File Prior to Visit  Medication Sig Dispense Refill   aspirin EC 81 MG tablet Take 81 mg by mouth daily.     atorvastatin (LIPITOR) 20 MG tablet Take 1 tablet (20 mg total) by mouth daily. 90 tablet 3   benzonatate (TESSALON PERLES) 100 MG capsule 1-2 capsules up to twice daily as needed for cough. 30 capsule 0   celecoxib (CELEBREX) 200 MG capsule Take 1 capsule (200 mg total) by mouth 2 (two) times daily. 180 capsule 3  CIALIS 5 MG tablet Take 1 tablet (5 mg total) by mouth daily. 10 tablet 6   doxycycline (VIBRA-TABS) 100 MG tablet Take 1 tablet (100 mg total) by mouth 2 (two) times daily. 20 tablet 0   finasteride (PROSCAR) 5 MG tablet Take 5 mg by mouth daily.     losartan (COZAAR) 50 MG tablet Take 1 tablet (50 mg total) by mouth daily. 90 tablet 3   tamsulosin (FLOMAX) 0.4 MG CAPS capsule TAKE 1 CAPSULE BY MOUTH TWICE A DAY 180 capsule 3   TURMERIC PO Take by mouth.     No current facility-administered medications on file prior to visit.        ROS:  All others reviewed and negative.  Objective        PE:  BP 122/76 (BP Location: Right Arm, Patient Position: Sitting, Cuff Size:  Normal)   Pulse 75   Temp 98.9 F (37.2 C) (Oral)   Ht 5\' 10"  (1.778 m)   Wt 176 lb (79.8 kg)   SpO2 97%   BMI 25.25 kg/m                 Constitutional: Pt appears in NAD               HENT: Head: NCAT.                Right Ear: External ear normal.                 Left Ear: External ear normal. Bilat tm's with mild erythema.  Max sinus areas mild tender.  Pharynx with mild erythema, no exudate               Eyes: . Pupils are equal, round, and reactive to light. Conjunctivae and EOM are normal               Nose: without d/c or deformity               Neck: Neck supple. Gross normal ROM               Cardiovascular: Normal rate and regular rhythm.                 Pulmonary/Chest: Effort normal and breath sounds without rales or wheezing.                Abd:  Soft, NT, ND, + BS, no organomegaly; spine nontender               Neurological: Pt is alert. At baseline orientation, motor grossly intact               Skin: Skin is warm. No rashes, no other new lesions, LE edema - none               Psychiatric: Pt behavior is normal without agitation   Micro: none  Cardiac tracings I have personally interpreted today:  none  Pertinent Radiological findings (summarize): none   Lab Results  Component Value Date   WBC 6.4 01/29/2023   HGB 14.4 01/29/2023   HCT 43.2 01/29/2023   PLT 196.0 01/29/2023   GLUCOSE 95 01/29/2023   CHOL 144 01/29/2023   TRIG 70.0 01/29/2023   HDL 61.30 01/29/2023   LDLDIRECT 86.0 12/04/2020   LDLCALC 69 01/29/2023   ALT 16 01/29/2023   AST 22 01/29/2023   NA 136 01/29/2023   K 4.4 01/29/2023   CL 102 01/29/2023   CREATININE  0.82 01/29/2023   BUN 15 01/29/2023   CO2 27 01/29/2023   TSH 1.18 01/29/2023   PSA 3.32 01/29/2023   HGBA1C 5.5 01/29/2023   MICROALBUR <0.7 01/29/2023   Assessment/Plan:  Peter Leblanc is a 67 y.o. White or Caucasian [1] male with  has a past medical history of ALLERGIC RHINITIS (02/15/2007), Allergy, Anxiety (08/23/2011),  BPH (benign prostatic hypertrophy) with urinary obstruction (11/16/2012), CONJUNCTIVITIS, INFECTIOUS (09/04/2008), ERECTILE DYSFUNCTION (09/26/2008), Genital herpes, HTN (hypertension) (12/06/2015), HYPERLIPIDEMIA (11/08/2007), KNEE PAIN, BILATERAL (11/29/2009), PSA, INCREASED (11/08/2007), SKIN LESION (11/29/2009), Slowing of urinary stream (09/04/2008), TIA (transient ischemic attack) (12/06/2015), and VULVAR ABSCESS (11/27/2009).  Encounter for well adult exam with abnormal findings Age and sex appropriate education and counseling updated with regular exercise and diet Referrals for preventative services - none needed Immunizations addressed - declines covid booster Smoking counseling  - none needed Evidence for depression or other mood disorder - none significant Most recent labs reviewed. I have personally reviewed and have noted: 1) the patient's medical and social history 2) The patient's current medications and supplements 3) The patient's height, weight, and BMI have been recorded in the chart   Vitamin D deficiency Last vitamin D Lab Results  Component Value Date   VD25OH 37.87 01/29/2023   Low, to start oral replacement   Hyperglycemia Lab Results  Component Value Date   HGBA1C 5.5 01/29/2023   Stable, pt to continue current medical treatment  - diet, wt control   HTN (hypertension) BP Readings from Last 3 Encounters:  05/10/23 122/76  01/29/23 122/82  05/06/22 138/82   Stable, pt to continue medical treatment losartan 50 mg qd   HLD (hyperlipidemia) Lab Results  Component Value Date   LDLCALC 69 01/29/2023   Stable, pt to continue current statin lipitor 20 qd   Acute upper respiratory infection Mild to mod, for antibx course zpack and cough med prn,,  to f/u any worsening symptoms or concerns  Followup: Return in about 1 year (around 05/09/2024).  Oliver Barre, MD 05/10/2023 1:11 PM [facility] - [facility]

## 2023-05-10 NOTE — Assessment & Plan Note (Signed)

## 2023-05-17 ENCOUNTER — Encounter: Payer: Self-pay | Admitting: Cardiovascular Disease

## 2023-05-17 NOTE — Progress Notes (Unsigned)
Cardiology Office Note:    Date:  05/18/2023   ID:  Peter Leblanc, DOB 1955-08-19, MRN [id number]  PCP:  Peter Levins, MD   [facility] Providers Cardiologist:  Peter Leblanc to update primary MD,subspecialty MD or APP then REFRESH:1}    Referring MD: Peter Levins, MD   Chief Complaint  Patient presents with   Hypertension    Sept. 2, 20222    Peter Leblanc is a 67 y.o. male with a hx of palpitations. We were asked to see him by Dr. Jonny Leblanc for further evaluation of his palpitations.  6-8 months of heart racing Last for 5-10 min Feels weak initially and notices that his heart rate is faster than usual. Counts a HR of 90s Typically occurs late in the day.  Is not associated with eating or drinking. Works as a Games developer, walks quite a bit  Does not exercise  Is busy, but no real cardio  Nov. 26, 2024 Peter Leblanc is seen for follow up of his palpitations, HTN  He had  coronary calcium CT scan .  CAC score is 534 ( 82 percentile for age / sex matched controls )   Is having some sharp chest pain  Off and on for 2 weeks Overall his symptoms do not sound cardiac   Walks every day , 12, 000 steps a day  Not much cardio.  We talked about the benefits of getting regular cardio         Past Medical History:  Diagnosis Date   ALLERGIC RHINITIS 02/15/2007   Allergy    Anxiety 08/23/2011   BPH (benign prostatic hypertrophy) with urinary obstruction 11/16/2012   CONJUNCTIVITIS, INFECTIOUS 09/04/2008   ERECTILE DYSFUNCTION 09/26/2008   Genital herpes    HTN (hypertension) 12/06/2015   HYPERLIPIDEMIA 11/08/2007   KNEE PAIN, BILATERAL 11/29/2009   PSA, INCREASED 11/08/2007   SKIN LESION 11/29/2009   Slowing of urinary stream 09/04/2008   TIA (transient ischemic attack) 12/06/2015   VULVAR ABSCESS 11/27/2009    Past Surgical History:  Procedure Laterality Date   COLONOSCOPY  12/02/2006   w/Brodie    GANGLION CYST EXCISION     NASAL SINUS SURGERY  1997   PROSTATE BIOPSY      TONSILLECTOMY     VASECTOMY      Current Medications: Current Meds  Medication Sig   aspirin EC 81 MG tablet Take 81 mg by mouth daily.   celecoxib (CELEBREX) 200 MG capsule Take 1 capsule (200 mg total) by mouth 2 (two) times daily.   CIALIS 5 MG tablet Take 1 tablet (5 mg total) by mouth daily.   finasteride (PROSCAR) 5 MG tablet Take 5 mg by mouth daily.   losartan (COZAAR) 50 MG tablet Take 1 tablet (50 mg total) by mouth daily.   tamsulosin (FLOMAX) 0.4 MG CAPS capsule TAKE 1 CAPSULE BY MOUTH TWICE A DAY   TURMERIC PO Take by mouth.   [DISCONTINUED] atorvastatin (LIPITOR) 20 MG tablet Take 1 tablet (20 mg total) by mouth daily.     Allergies:   Patient has no known allergies.   Social History   Socioeconomic History   Marital status: Married    Spouse name: Not on file   Number of children: Not on file   Years of education: Not on file   Highest education level: Not on file  Occupational History   Not on file  Tobacco Use   Smoking status: Never   Smokeless tobacco: Never  Vaping Use   Vaping status: Never Used  Substance and Sexual Activity   Alcohol use: Yes    Alcohol/week: 14.0 standard drinks of alcohol    Types: 14 Cans of beer per week   Drug use: No   Sexual activity: Not on file  Other Topics Concern   Not on file  Social History Narrative   Not on file   Social Determinants of Health   Financial Resource Strain: Not on file  Food Insecurity: Not on file  Transportation Needs: Not on file  Physical Activity: Not on file  Stress: Not on file  Social Connections: Not on file     Family History: The patient's He was adopted. Family history is unknown by patient.  ROS:   Please see the history of present illness.     All other systems reviewed and are negative.  EKGs/Labs/Other Studies Reviewed:    The following studies were reviewed today:   EKG:    EKG Interpretation Date/Time:  Tuesday May 18 2023 14:37:25 EST Ventricular  Rate:  82 PR Interval:  136 QRS Duration:  142 QT Interval:  388 QTC Calculation: 453 R Axis:   93  Text Interpretation: Normal sinus rhythm Right bundle branch block No previous ECGs available Confirmed by Peter Leblanc [phone]) on 05/18/2023 3:13:34 PM     Recent Labs: 01/29/2023: ALT 16; BUN 15; Creatinine, Ser 0.82; Hemoglobin 14.4; Platelets 196.0; Potassium 4.4; Sodium 136; TSH 1.18  Recent Lipid Panel    Component Value Date/Time   CHOL 144 01/29/2023 1051   TRIG 70.0 01/29/2023 1051   HDL 61.30 01/29/2023 1051   CHOLHDL 2 01/29/2023 1051   VLDL 14.0 01/29/2023 1051   LDLCALC 69 01/29/2023 1051   LDLDIRECT 86.0 12/04/2020 1638     Risk Assessment/Calculations:           Physical Exam:    Physical Exam: Blood pressure 118/78, pulse 82, height 5\' 10"  (1.778 m), weight 180 lb (81.6 kg), SpO2 98%.       GEN:  Well nourished, well developed in no acute distress HEENT: Normal NECK: No JVD; No carotid bruits LYMPHATICS: No lymphadenopathy CARDIAC: RRR .  RBBB , no murmurs, rubs, gallops RESPIRATORY:  Clear to auscultation without rales, wheezing or rhonchi  ABDOMEN: Soft, non-tender, non-distended MUSCULOSKELETAL:  No edema; No deformity  SKIN: Warm and dry NEUROLOGIC:  Alert and oriented x 3   ASSESSMENT:    1. Elevated coronary artery calcium score     PLAN:      Coronary artery calcifications: Peter Leblanc has a high coronary calcium score -534- which places him in the 82nd percentile for age and sex matched controls. He does not have any real symptoms but he does not get much cardio exercise.  I encouraged him to get regular cardiac exercise.  He has hyperlipidemia.  His LDL is 69.  He has right bundle branch block on his resting EKG.  He does not have any ST or T wave changes.   I would like to increase his atorvastatin to 40 mg a day.  Will get an echocardiogram for further evaluation of his LV function and to evaluate his right bundle branch block.  Will  consider getting a Myoview study or perhaps coronary CT angiogram in the future.   2.  RBBB :   echo .  Consider myoview or cor CTA   Will see him in 3 months             Medication  Adjustments/Labs and Tests Ordered: Current medicines are reviewed at length with the patient today.  Concerns regarding medicines are outlined above.  Orders Placed This Encounter  Procedures   EKG 12-Lead    No orders of the defined types were placed in this encounter.    Patient Instructions  Medication Instructions:  INCREASE Atorvastatin to 40mg  daily *If you need a refill on your cardiac medications before your next appointment, please call your pharmacy*   Lab Work: BMET, Lipids, ALT in 3 months If you have labs (blood work) drawn today and your tests are completely normal, you will receive your results only by: MyChart Message (if you have MyChart) OR A paper copy in the mail If you have any lab test that is abnormal or we need to change your treatment, we will call you to review the results.  Follow-Up: At [facility], you and your health needs are our priority.  As part of our continuing mission to provide you with exceptional heart care, we have created designated Provider Care Teams.  These Care Teams include your primary Cardiologist (physician) and Advanced Practice Providers (APPs -  Physician Assistants and Nurse Practitioners) who all work together to provide you with the care you need, when you need it.  Your next appointment:   3 month(s)  Provider:   Kristeen Miss, MD       Signed, Peter Miss, MD  05/18/2023 3:13 PM    [facility]

## 2023-05-18 ENCOUNTER — Ambulatory Visit
Payer: No Typology Code available for payment source | Attending: Cardiovascular Disease | Admitting: Cardiovascular Disease

## 2023-05-18 ENCOUNTER — Encounter: Payer: Self-pay | Admitting: Cardiovascular Disease

## 2023-05-18 VITALS — BP 118/78 | HR 82 | Ht 70.0 in | Wt 180.0 lb

## 2023-05-18 DIAGNOSIS — I10 Essential (primary) hypertension: Secondary | ICD-10-CM

## 2023-05-18 DIAGNOSIS — R931 Abnormal findings on diagnostic imaging of heart and coronary circulation: Secondary | ICD-10-CM | POA: Diagnosis not present

## 2023-05-18 DIAGNOSIS — E782 Mixed hyperlipidemia: Secondary | ICD-10-CM | POA: Diagnosis not present

## 2023-05-18 DIAGNOSIS — I4510 Unspecified right bundle-branch block: Secondary | ICD-10-CM | POA: Diagnosis not present

## 2023-05-18 MED ORDER — ATORVASTATIN CALCIUM 40 MG PO TABS: 40.0000 mg | ORAL_TABLET | Freq: Every day | ORAL | 3 refills | Status: DC

## 2023-05-18 NOTE — Patient Instructions (Addendum)
Medication Instructions:  INCREASE Atorvastatin to 40mg  daily *If you need a refill on your cardiac medications before your next appointment, please call your pharmacy*   Lab Work: BMET, Lipids, ALT in 3 months If you have labs (blood work) drawn today and your tests are completely normal, you will receive your results only by: MyChart Message (if you have MyChart) OR A paper copy in the mail If you have any lab test that is abnormal or we need to change your treatment, we will call you to review the results.  ECHO Your physician has requested that you have an echocardiogram. Echocardiography is a painless test that uses sound waves to create images of your heart. It provides your doctor with information about the size and shape of your heart and how well your heart's chambers and valves are working. This procedure takes approximately one hour. There are no restrictions for this procedure. Please do NOT wear cologne, perfume, aftershave, or lotions (deodorant is allowed). Please arrive 15 minutes prior to your appointment time.  Please note: We ask at that you not bring children with you during ultrasound (echo/ vascular) testing. Due to room size and safety concerns, children are not allowed in the ultrasound rooms during exams. Our front office staff cannot provide observation of children in our lobby area while testing is being conducted. An adult accompanying a patient to their appointment will only be allowed in the ultrasound room at the discretion of the ultrasound technician under special circumstances. We apologize for any inconvenience.  Follow-Up: At [facility], you and your health needs are our priority.  As part of our continuing mission to provide you with exceptional heart care, we have created designated Provider Care Teams.  These Care Teams include your primary Cardiologist (physician) and Advanced Practice Providers (APPs -  Physician Assistants and Nurse  Practitioners) who all work together to provide you with the care you need, when you need it.  Your next appointment:   3 month(s)  Provider:   Kristeen Miss, MD

## 2023-06-11 ENCOUNTER — Observation Stay (HOSPITAL_COMMUNITY): Payer: No Typology Code available for payment source

## 2023-06-11 ENCOUNTER — Encounter (HOSPITAL_COMMUNITY): Payer: Self-pay | Admitting: Radiology

## 2023-06-11 ENCOUNTER — Observation Stay (HOSPITAL_COMMUNITY)
Admission: EM | Admit: 2023-06-11 | Discharge: 2023-06-11 | Discharge status: Against Medical Advice | Payer: No Typology Code available for payment source | Attending: Emergency Medicine | Admitting: Emergency Medicine

## 2023-06-11 ENCOUNTER — Emergency Department (HOSPITAL_COMMUNITY): Payer: No Typology Code available for payment source

## 2023-06-11 ENCOUNTER — Other Ambulatory Visit: Payer: Self-pay

## 2023-06-11 DIAGNOSIS — Z7982 Long term (current) use of aspirin: Secondary | ICD-10-CM | POA: Insufficient documentation

## 2023-06-11 DIAGNOSIS — Z79899 Other long term (current) drug therapy: Secondary | ICD-10-CM | POA: Diagnosis not present

## 2023-06-11 DIAGNOSIS — G459 Transient cerebral ischemic attack, unspecified: Principal | ICD-10-CM | POA: Diagnosis present

## 2023-06-11 DIAGNOSIS — I10 Essential (primary) hypertension: Secondary | ICD-10-CM | POA: Diagnosis not present

## 2023-06-11 DIAGNOSIS — R29810 Facial weakness: Secondary | ICD-10-CM | POA: Diagnosis present

## 2023-06-11 LAB — URINALYSIS, ROUTINE W REFLEX MICROSCOPIC
Bilirubin Urine: NEGATIVE
Glucose, UA: 50 mg/dL — AB
Hgb urine dipstick: NEGATIVE
Ketones, ur: NEGATIVE mg/dL
Leukocytes,Ua: NEGATIVE
Nitrite: NEGATIVE
Protein, ur: NEGATIVE mg/dL
Specific Gravity, Urine: >1.046 — ABNORMAL HIGH (ref 1.005–1.030)
pH: 6 (ref 5.0–8.0)

## 2023-06-11 LAB — DIFFERENTIAL
Abs Immature Granulocytes: 0.02 10*3/uL (ref 0.00–0.07)
Basophils Absolute: 0 10*3/uL (ref 0.0–0.1)
Basophils Relative: 1 %
Eosinophils Absolute: 0.1 10*3/uL (ref 0.0–0.5)
Eosinophils Relative: 2 %
Immature Granulocytes: 0 %
Lymphocytes Relative: 21 %
Lymphs Abs: 1.6 10*3/uL (ref 0.7–4.0)
Monocytes Absolute: 0.7 10*3/uL (ref 0.1–1.0)
Monocytes Relative: 9 %
Neutro Abs: 5.1 10*3/uL (ref 1.7–7.7)
Neutrophils Relative %: 67 %

## 2023-06-11 LAB — COMPREHENSIVE METABOLIC PANEL
ALT: 29 U/L (ref 0–44)
AST: 34 U/L (ref 15–41)
Albumin: 4.4 g/dL (ref 3.5–5.0)
Alkaline Phosphatase: 47 U/L (ref 38–126)
Anion gap: 8 (ref 5–15)
BUN: 22 mg/dL (ref 8–23)
CO2: 26 mmol/L (ref 22–32)
Calcium: 8.9 mg/dL (ref 8.9–10.3)
Chloride: 103 mmol/L (ref 98–111)
Creatinine, Ser: 0.8 mg/dL (ref 0.61–1.24)
GFR, Estimated: >60 mL/min (ref >60)
Glucose, Bld: 114 mg/dL — ABNORMAL HIGH (ref 70–99)
Potassium: 3.8 mmol/L (ref 3.5–5.1)
Sodium: 137 mmol/L (ref 135–145)
Total Bilirubin: 0.9 mg/dL (ref <1.2)
Total Protein: 7.3 g/dL (ref 6.5–8.1)

## 2023-06-11 LAB — I-STAT CHEM 8, ED
BUN: 21 mg/dL (ref 8–23)
Calcium, Ion: 1.18 mmol/L (ref 1.15–1.40)
Chloride: 104 mmol/L (ref 98–111)
Creatinine, Ser: 0.9 mg/dL (ref 0.61–1.24)
Glucose, Bld: 116 mg/dL — ABNORMAL HIGH (ref 70–99)
HCT: 42 % (ref 39.0–52.0)
Hemoglobin: 14.3 g/dL (ref 13.0–17.0)
Potassium: 3.8 mmol/L (ref 3.5–5.1)
Sodium: 140 mmol/L (ref 135–145)
TCO2: 27 mmol/L (ref 22–32)

## 2023-06-11 LAB — PROTIME-INR
INR: 0.9 (ref 0.8–1.2)
Prothrombin Time: 12.7 s (ref 11.4–15.2)

## 2023-06-11 LAB — CBC
HCT: 43.1 % (ref 39.0–52.0)
Hemoglobin: 15 g/dL (ref 13.0–17.0)
MCH: 34.4 pg — ABNORMAL HIGH (ref 26.0–34.0)
MCHC: 34.8 g/dL (ref 30.0–36.0)
MCV: 98.9 fL (ref 80.0–100.0)
Platelets: 231 10*3/uL (ref 150–400)
RBC: 4.36 MIL/uL (ref 4.22–5.81)
RDW: 12 % (ref 11.5–15.5)
WBC: 7.6 10*3/uL (ref 4.0–10.5)
nRBC: 0 % (ref 0.0–0.2)

## 2023-06-11 LAB — RAPID URINE DRUG SCREEN, HOSP PERFORMED
Amphetamines: NOT DETECTED
Barbiturates: NOT DETECTED
Benzodiazepines: NOT DETECTED
Cocaine: NOT DETECTED
Opiates: NOT DETECTED
Tetrahydrocannabinol: NOT DETECTED

## 2023-06-11 LAB — HEMOGLOBIN A1C
Hgb A1c MFr Bld: 5.4 % (ref 4.8–5.6)
Mean Plasma Glucose: 108.28 mg/dL

## 2023-06-11 LAB — APTT: aPTT: 24 s (ref 24–36)

## 2023-06-11 LAB — ETHANOL: Alcohol, Ethyl (B): <10 mg/dL (ref <10)

## 2023-06-11 MED ORDER — STROKE: EARLY STAGES OF RECOVERY BOOK: Freq: Once | Status: DC

## 2023-06-11 MED ORDER — ASPIRIN 81 MG PO TBEC: 81.0000 mg | DELAYED_RELEASE_TABLET | Freq: Every day | ORAL | Status: DC

## 2023-06-11 MED ORDER — ACETAMINOPHEN 650 MG RE SUPP: 650.0000 mg | Freq: Four times a day (QID) | RECTAL | Status: DC | PRN

## 2023-06-11 MED ORDER — TRAZODONE HCL 50 MG PO TABS: 25.0000 mg | ORAL_TABLET | Freq: Every evening | ORAL | Status: DC | PRN

## 2023-06-11 MED ORDER — ENOXAPARIN SODIUM 40 MG/0.4ML IJ SOSY: 40.0000 mg | PREFILLED_SYRINGE | INTRAMUSCULAR | Status: DC

## 2023-06-11 MED ORDER — SODIUM CHLORIDE (PF) 0.9 % IJ SOLN
INTRAMUSCULAR | Status: AC
  Filled 2023-06-11: qty 50

## 2023-06-11 MED ORDER — ONDANSETRON HCL 4 MG/2ML IJ SOLN: 4.0000 mg | Freq: Four times a day (QID) | INTRAMUSCULAR | Status: DC | PRN

## 2023-06-11 MED ORDER — ACETAMINOPHEN 325 MG PO TABS: 650.0000 mg | ORAL_TABLET | Freq: Four times a day (QID) | ORAL | Status: DC | PRN

## 2023-06-11 MED ORDER — ALBUTEROL SULFATE (2.5 MG/3ML) 0.083% IN NEBU: 2.5000 mg | INHALATION_SOLUTION | RESPIRATORY_TRACT | Status: DC | PRN

## 2023-06-11 MED ORDER — CLOPIDOGREL BISULFATE 75 MG PO TABS: 75.0000 mg | ORAL_TABLET | Freq: Every day | ORAL | Status: DC

## 2023-06-11 MED ORDER — IOHEXOL 350 MG/ML SOLN
75.0000 mL | Freq: Once | INTRAVENOUS | Status: AC | PRN
  Administered 2023-06-11: 75 mL via INTRAVENOUS

## 2023-06-11 MED ORDER — ATORVASTATIN CALCIUM 40 MG PO TABS
40.0000 mg | ORAL_TABLET | Freq: Every day | ORAL | Status: DC
  Administered 2023-06-11: 40 mg via ORAL
  Filled 2023-06-11: qty 1

## 2023-06-11 MED ORDER — ONDANSETRON HCL 4 MG PO TABS: 4.0000 mg | ORAL_TABLET | Freq: Four times a day (QID) | ORAL | Status: DC | PRN

## 2023-06-11 MED ORDER — ASPIRIN 81 MG PO CHEW: 81.0000 mg | CHEWABLE_TABLET | Freq: Every day | ORAL | Status: DC

## 2023-06-11 MED ORDER — CLOPIDOGREL BISULFATE 300 MG PO TABS
300.0000 mg | ORAL_TABLET | Freq: Once | ORAL | Status: AC
  Administered 2023-06-11: 300 mg via ORAL
  Filled 2023-06-11: qty 1

## 2023-06-11 NOTE — ED Notes (Signed)
Pt to MRI via WC

## 2023-06-11 NOTE — ED Provider Triage Note (Signed)
Emergency Medicine Provider Triage Evaluation Note  Peter Leblanc , a 67 y.o. male  was evaluated in triage.  Pt complains of weakness. 30 min ago while talking to his co-worker they noticed that he has a slight droop to his L side of face.  Pt then report tingling sensation to L arm.  Sxs mild but still present.  No headache, neck pain, cp, sob, vision changes, confusion  Review of Systems  Positive: As above Negative: As above  Physical Exam  BP (!) 142/97 (BP Location: Left Arm)   Pulse 98   Temp 98.2 F (36.8 C) (Oral)   Resp 18   Ht 5\' 10"  (1.778 m)   Wt 81.6 kg   SpO2 98%   BMI 25.83 kg/m  Gen:   Awake, no distress   Resp:  Normal effort  MSK:   Moves extremities without difficulty  Other:  Neurologic exam:  Speech clear, pupils equal round reactive to light, extraocular movements intact Normal peripheral visual fields Cranial nerves III through XII normal including no facial droop Follows commands, moves all extremities x4, normal strength to bilateral upper and lower extremities at all major muscle groups including grip Sensation normal to light touch  Coordination intact, no limb ataxia, finger-nose-finger normal Rapid alternating movements normal No pronator drift Gait normal   Medical Decision Making  Medically screening exam initiated at 2:09 PM.  Appropriate orders placed.  Peter Leblanc was informed that the remainder of the evaluation will be completed by another provider, this initial triage assessment does not replace that evaluation, and the importance of remaining in the ED until their evaluation is complete.  Subjective altered of sensation to L side of face and L arm, LKN 30 min ago.  No focal neuro deficit on initial exam.  I discussed care with Dr. Theresia Lo.  Will activate code stroke   Fayrene Helper, PA-C 06/11/23 1416

## 2023-06-11 NOTE — Consult Note (Signed)
[facility] Telemedicine Consult   Requesting Provider: Elayne Snare Consult Participants: Myself, patient, bedside nurse, atrium nurse Location of the provider: [facility] Location of the patient:  [facility]  This consult was provided via telemedicine with 2-way video and audio communication. The patient/family was informed that care would be provided in this way and agreed to receive care in this manner.    Chief Complaint: Transient left facial weakness  HPI: This is a 67 year old right-handed man with a past medical history significant for hypertension, hyperlipidemia, right parental branch block,  He was in his usual state of health today talking with friends when he suddenly began to have a left facial droop and perhaps some left arm/leg numbness and tingling.  By the time of my evaluation on camera his symptoms have fully resolved  He was recently evaluated by cardiology for episodes of racing heart rate lasting 5 to 10 minutes with some associated weakness, ongoing for 6 to 8 months in September 2022, for which she was instructed to hydrate, eat more protein and less soda and increase exercise due to concern that these were secondary to volume depletion.  He was subsequently reevaluated 05/18/2023, with notes of sharp chest pain on and off for 2 weeks not felt to be cardiac in nature although he was noted to have an elevated coronary calcium CT scan (82nd percentile for age/sex matched controls), and plan for echocardiogram which has not been completed given that EKG revealed right bundle branch block.  Atorvastatin was increased to 40 mg a day and he has been tolerating this medication well.  He is planned for reevaluation by cardiology in about 3 months  He does take Celebrex 300 mg twice daily for arthritis pain   Plan for echocardiogram   LKW: 1 PM Thrombolytic given?: No, symptoms resolved IR Thrombectomy? No, exam not  consistent with LVO Modified Rankin Scale: 0-Completely asymptomatic and back to baseline post- stroke Time of teleneurologist evaluation: 2:26 PM  Exam: Vitals:   06/11/23 1400 06/11/23 1436  BP: (!) 142/97 139/85  Pulse: 98 81  Resp: 18 19  Temp: 98.2 F (36.8 C) 97.8 F (36.6 C)  SpO2: 98% 98%    General: No distress Pulmonary: breathing comfortably Cardiac: regular rate and rhythm on monitor   Observed to be standing with steady stance, able to remove his shirt independently for application of a EKG leads by nursing using both upper extremities and lower extremities equally with good balance and good functional status  NIH Stroke scale 1A: Level of Consciousness - 0 1B: Ask Month and Age - 0 1C: 'Blink Eyes' & 'Squeeze Hands' - 0 2: Test Horizontal Extraocular Movements - 0 3: Test Visual Fields - 0 4: Test Facial Palsy - 0 5A: Test Left Arm Motor Drift - 0 5B: Test Right Arm Motor Drift - 0 6A: Test Left Leg Motor Drift - 0 6B: Test Right Leg Motor Drift - 0 7: Test Limb Ataxia - 0 8: Test Sensation - 0 9: Test Language/Aphasia- 0 10: Test Dysarthria - 0 11: Test Extinction/Inattention - 0 NIHSS score: 0   Imaging Reviewed:  Head CT personally reviewed, agree with radiology no acute intracranial process, there are some chronic microvascular changes that could certainly mask an acute process.  Basilar artery hyperdense appearance felt to be artifactual given neither history nor examination are consistent with top of the basilar syndrome   Labs reviewed in epic and pertinent values follow:  Basic Metabolic Panel:  No results for input(s): "NA", "K", "CL", "CO2", "GLUCOSE", "BUN", "CREATININE", "CALCIUM", "MG", "PHOS" in the last 168 hours.  CBC: No results for input(s): "WBC", "NEUTROABS", "HGB", "HCT", "MCV", "PLT" in the last 168 hours.  Coagulation Studies: No results for input(s): "LABPROT", "INR" in the last 72 hours.   Labs still pending at the time of  this note  Assessment: Likely TIA versus small stroke too mild to treat.  Needs admission for TIA workup.  We did discuss there are some data that celecoxib is associated with potential increased risk of stroke and should not be taken while he is on Plavix at the very least.  Further discussion per stroke team tomorrow  Recommendations:  # TIA versus small stroke - Stroke labs HgbA1c, fasting lipid panel - MRI brain  - Appreciate CTA head and neck ordered by EDP - Frequent neuro checks per protocol for TIA - Echocardiogram - Continue home aspirin 81 mg daily - Plavix 300 mg load with 75 mg daily for 21 - 90 day course to be determined based on vessel imaging - Hold Celebrex at this time - Continue home atorvastatin 40 mg, dose adjustment pending repeat lipid panel - Risk factor modification - Telemetry monitoring; 30 day event monitor on discharge if no arrythmias captured  - Blood pressure goal   - Permissive hypertension to 220/120 for 24 to 48 hours pending negative MRI.  Normotension if MRI results negative - PT/OT/SLP consults not indicated as patient is at his baseline at this time - Stroke team to follow, please admit to [facility] - Recommendations conveyed to Dr. Theresia Lo via secure chat  Brooke Dare MD-PhD [facility] [phone]  If 8pm-8am, please page neurology on call as listed in AMION.  CRITICAL CARE Performed by: Gordy Councilman   Total critical care time: 30 minutes  Critical care time was exclusive of separately billable procedures and treating other patients; emergent evaluation for consideration of thrombolytic or thrombectomy  Critical care was necessary to treat or prevent imminent or life-threatening deterioration.  Critical care was time spent personally by me on the following activities: development of treatment plan with patient and/or surrogate as well as nursing, discussions with consultants, evaluation of patient's  response to treatment, examination of patient, obtaining history from patient or surrogate, ordering and performing treatments and interventions, ordering and review of laboratory studies, ordering and review of radiographic studies, pulse oximetry and re-evaluation of patient's condition.

## 2023-06-11 NOTE — ED Notes (Signed)
ED TO INPATIENT HANDOFF REPORT  ED Nurse Name and Phone #: Rebecca Eaton RN  S Name/Age/Gender Peter Leblanc 67 y.o. male Room/Bed: RESB/RESB  Code Status   Code Status: Full Code  Home/SNF/Other Home Patient oriented to: self, place, time, and situation Is this baseline? Yes   Triage Complete: Triage complete  Chief Complaint TIA (transient ischemic attack) [G45.9]  Triage Note PT states he was at lunch with some friends around 1pm today when one of his friends told him that his left side of face had a droop.States his left arm was also tingling. Symptoms have subsided now that he has arrives. VSS, NAD    Allergies No Known Allergies  Level of Care/Admitting Diagnosis ED Disposition     ED Disposition  Admit   Condition  --   Comment  Hospital Area: [facility] [[id number]]  Level of Care: Med-Surg [16]  May place patient in observation at [facility] or [facility] if equivalent level of care is available:: No  Covid Evaluation: Asymptomatic - no recent exposure (last 10 days) testing not required  Diagnosis: TIA (transient ischemic attack) [130865]  Admitting Physician: Maryln Gottron [[id number]]  Attending Physician: Kirby Crigler, Parks Neptune [[id number]]          B Medical/Surgery History Past Medical History:  Diagnosis Date   ALLERGIC RHINITIS 02/15/2007   Allergy    Anxiety 08/23/2011   BPH (benign prostatic hypertrophy) with urinary obstruction 11/16/2012   CONJUNCTIVITIS, INFECTIOUS 09/04/2008   ERECTILE DYSFUNCTION 09/26/2008   Genital herpes    HTN (hypertension) 12/06/2015   HYPERLIPIDEMIA 11/08/2007   KNEE PAIN, BILATERAL 11/29/2009   PSA, INCREASED 11/08/2007   SKIN LESION 11/29/2009   Slowing of urinary stream 09/04/2008   TIA (transient ischemic attack) 12/06/2015   VULVAR ABSCESS 11/27/2009   Past Surgical History:  Procedure Laterality Date   COLONOSCOPY  12/02/2006   w/Brodie    GANGLION CYST EXCISION     NASAL SINUS SURGERY  1997    PROSTATE BIOPSY     TONSILLECTOMY     VASECTOMY       A IV Location/Drains/Wounds Patient Lines/Drains/Airways Status     Active Line/Drains/Airways     Name Placement date Placement time Site Days   Peripheral IV 06/11/23 20 G 1" Right Antecubital 06/11/23  1432  Antecubital  less than 1            Intake/Output Last 24 hours No intake or output data in the 24 hours ending 06/11/23 1910  Labs/Imaging Results for orders placed or performed during the hospital encounter of 06/11/23 (from the past 48 hours)  Ethanol     Status: None   Collection Time: 06/11/23  2:16 PM  Result Value Ref Range   Alcohol, Ethyl (B) <10 <10 mg/dL    Comment: (NOTE) Lowest detectable limit for serum alcohol is 10 mg/dL.  For medical purposes only. Performed at [facility], [address] [place], [place] [address]   Protime-INR     Status: None   Collection Time: 06/11/23  2:16 PM  Result Value Ref Range   Prothrombin Time 12.7 11.4 - 15.2 seconds   INR 0.9 0.8 - 1.2    Comment: (NOTE) INR goal varies based on device and disease states. Performed at [facility], [address], [place], [place] [address]   APTT     Status: None   Collection Time: 06/11/23  2:16 PM  Result Value Ref Range  aPTT 24 24 - 36 seconds    Comment: Performed at [facility], [address], [place], [place] [address]  CBC     Status: Abnormal   Collection Time: 06/11/23  2:16 PM  Result Value Ref Range   WBC 7.6 4.0 - 10.5 K/uL   RBC 4.36 4.22 - 5.81 MIL/uL   Hemoglobin 15.0 13.0 - 17.0 g/dL   HCT 60.4 54.0 - 98.1 %   MCV 98.9 80.0 - 100.0 fL   MCH 34.4 (H) 26.0 - 34.0 pg   MCHC 34.8 30.0 - 36.0 g/dL   RDW 19.1 47.8 - 29.5 %   Platelets 231 150 - 400 K/uL   nRBC 0.0 0.0 - 0.2 %    Comment: Performed at [facility], [address] [place], [place] [address]  Differential     Status: None   Collection Time: 06/11/23   2:16 PM  Result Value Ref Range   Neutrophils Relative % 67 %   Neutro Abs 5.1 1.7 - 7.7 K/uL   Lymphocytes Relative 21 %   Lymphs Abs 1.6 0.7 - 4.0 K/uL   Monocytes Relative 9 %   Monocytes Absolute 0.7 0.1 - 1.0 K/uL   Eosinophils Relative 2 %   Eosinophils Absolute 0.1 0.0 - 0.5 K/uL   Basophils Relative 1 %   Basophils Absolute 0.0 0.0 - 0.1 K/uL   Immature Granulocytes 0 %   Abs Immature Granulocytes 0.02 0.00 - 0.07 K/uL    Comment: Performed at [facility], [address] [place], [place] [address]  Comprehensive metabolic panel     Status: Abnormal   Collection Time: 06/11/23  2:16 PM  Result Value Ref Range   Sodium 137 135 - 145 mmol/L   Potassium 3.8 3.5 - 5.1 mmol/L   Chloride 103 98 - 111 mmol/L   CO2 26 22 - 32 mmol/L   Glucose, Bld 114 (H) 70 - 99 mg/dL    Comment: Glucose reference range applies only to samples taken after fasting for at least 8 hours.   BUN 22 8 - 23 mg/dL   Creatinine, Ser 4.69 0.61 - 1.24 mg/dL   Calcium 8.9 8.9 - 62.9 mg/dL   Total Protein 7.3 6.5 - 8.1 g/dL   Albumin 4.4 3.5 - 5.0 g/dL   AST 34 15 - 41 U/L   ALT 29 0 - 44 U/L   Alkaline Phosphatase 47 38 - 126 U/L   Total Bilirubin 0.9 <1.2 mg/dL   GFR, Estimated >52 >84 mL/min    Comment: (NOTE) Calculated using the CKD-EPI Creatinine Equation (2021)    Anion gap 8 5 - 15    Comment: Performed at [facility], [address] [place], [place] [address]  Urine rapid drug screen (hosp performed)     Status: None   Collection Time: 06/11/23  3:48 PM  Result Value Ref Range   Opiates NONE DETECTED NONE DETECTED   Cocaine NONE DETECTED NONE DETECTED   Benzodiazepines NONE DETECTED NONE DETECTED   Amphetamines NONE DETECTED NONE DETECTED   Tetrahydrocannabinol NONE DETECTED NONE DETECTED   Barbiturates NONE DETECTED NONE DETECTED    Comment: (NOTE) DRUG SCREEN FOR MEDICAL PURPOSES ONLY.  IF CONFIRMATION IS NEEDED FOR ANY PURPOSE, NOTIFY  LAB WITHIN 5 DAYS.  LOWEST DETECTABLE LIMITS FOR URINE DRUG SCREEN Drug Class                     Cutoff (  ng/mL) Amphetamine and metabolites    1000 Barbiturate and metabolites    200 Benzodiazepine                 200 Opiates and metabolites        300 Cocaine and metabolites        300 THC                            50 Performed at [facility], [address] [place], [place] [address]   Urinalysis, Routine w reflex microscopic -Urine, Clean Catch     Status: Abnormal   Collection Time: 06/11/23  3:48 PM  Result Value Ref Range   Color, Urine YELLOW YELLOW   APPearance CLEAR CLEAR   Specific Gravity, Urine >1.046 (H) 1.005 - 1.030   pH 6.0 5.0 - 8.0   Glucose, UA 50 (A) NEGATIVE mg/dL   Hgb urine dipstick NEGATIVE NEGATIVE   Bilirubin Urine NEGATIVE NEGATIVE   Ketones, ur NEGATIVE NEGATIVE mg/dL   Protein, ur NEGATIVE NEGATIVE mg/dL   Nitrite NEGATIVE NEGATIVE   Leukocytes,Ua NEGATIVE NEGATIVE    Comment: Performed at [facility], [address], [place], [place] [address]   MR BRAIN WO CONTRAST Result Date: 06/11/2023 CLINICAL DATA:  Weakness, facial droop, stroke suspected EXAM: MRI HEAD WITHOUT CONTRAST TECHNIQUE: Multiplanar, multiecho pulse sequences of the brain and surrounding structures were obtained without intravenous contrast. COMPARISON:  12/15/2015 MRI head FINDINGS: Brain: No restricted diffusion to suggest acute or subacute infarct. No acute hemorrhage, mass, mass effect, or midline shift. No hydrocephalus or extra-axial collection. Pituitary and craniocervical junction within normal limits. No hemosiderin deposition to suggest remote hemorrhage. Normal cerebral volume for age. Scattered T2 hyperintense signal in the periventricular white matter, likely the sequela of mild-to-moderate chronic small vessel ischemic disease. No evidence of remote cortical or lacunar infarct. Vascular: Normal arterial flow voids. Skull and upper  cervical spine: Normal marrow signal. Sinuses/Orbits: Clear paranasal sinuses. No acute finding in the orbits. Other: Trace fluid in the bilateral mastoid air cells. IMPRESSION: No acute intracranial process. No evidence of acute or subacute infarct. Electronically Signed   By: Wiliam Ke M.D.   On: 06/11/2023 18:16   CT Angio Head Neck W WO CM (CODE STROKE) Result Date: 06/11/2023 CLINICAL DATA:  Neuro deficit, acute, stroke suspected EXAM: CT ANGIOGRAPHY HEAD AND NECK WITH AND WITHOUT CONTRAST TECHNIQUE: Multidetector CT imaging of the head and neck was performed using the standard protocol during bolus administration of intravenous contrast. Multiplanar CT image reconstructions and MIPs were obtained to evaluate the vascular anatomy. Carotid stenosis measurements (when applicable) are obtained utilizing NASCET criteria, using the distal internal carotid diameter as the denominator. RADIATION DOSE REDUCTION: This exam was performed according to the departmental dose-optimization program which includes automated exposure control, adjustment of the mA and/or kV according to patient size and/or use of iterative reconstruction technique. CONTRAST:  75mL OMNIPAQUE IOHEXOL 350 MG/ML SOLN COMPARISON:  Same-day head CT.  CT neck 05/20/2020 FINDINGS: CT HEAD FINDINGS See same day CT head for intracranial findings CTA NECK FINDINGS Aortic arch: Standard branching. Imaged portion shows no evidence of aneurysm or dissection. No significant stenosis of the major arch vessel origins. Right carotid system: No evidence of dissection, stenosis (50% or greater), or occlusion. Left carotid system: No evidence of dissection, stenosis (50% or greater), or occlusion. Vertebral arteries: Left dominant. No evidence of dissection, stenosis (50% or greater),  or occlusion. Skeleton: Negative. Other neck: Negative. Upper chest: Negative. Review of the MIP images confirms the above findings CTA HEAD FINDINGS Anterior circulation: No  significant stenosis, proximal occlusion, aneurysm, or vascular malformation. Posterior circulation: No significant stenosis, proximal occlusion, aneurysm, or vascular malformation. Venous sinuses: As permitted by contrast timing, patent. Anatomic variants: None Review of the MIP images confirms the above findings IMPRESSION: 1. No intracranial large vessel occlusion or significant stenosis. 2. No hemodynamically significant stenosis in the neck. Electronically Signed   By: Lorenza Cambridge M.D.   On: 06/11/2023 15:05   CT HEAD CODE STROKE WO CONTRAST Result Date: 06/11/2023 CLINICAL DATA:  Code stroke.  Left arm tingling EXAM: CT HEAD WITHOUT CONTRAST TECHNIQUE: Contiguous axial images were obtained from the base of the skull through the vertex without intravenous contrast. RADIATION DOSE REDUCTION: This exam was performed according to the departmental dose-optimization program which includes automated exposure control, adjustment of the mA and/or kV according to patient size and/or use of iterative reconstruction technique. COMPARISON:  Brain MR 12/15/15 FINDINGS: Brain: No hemorrhage. No hydrocephalus. No extra-axial fluid collection. There is a background of mild chronic microvascular ischemic change. No CT evidence of an acute cortical infarct. No mass effect. No mass lesion. Vascular: Somewhat hyperdense appearance of the tip of the basilar artery is most likely artifactual. Skull: No focal lesion. Likely chronic comminuted nasal bone fractures. Sinuses/Orbits: No middle ear or mastoid effusion. Paranasal sinuses are notable for mucosal thickening in the left maxillary sinus. Orbits are unremarkable. Other: None. ASPECTS ([place] Stroke Program Early CT Score): 10 IMPRESSION: 1. No hemorrhage or CT evidence of an acute cortical infarct. 2. Somewhat hyperdense appearance of the tip of the basilar artery is most likely artifactual. If there is clinical concern for a basilar artery thrombosis, consider further  evaluation with a CTA. Findings were discussed with Dr. Greta Doom on 06/11/23 at 2:34 PM. Electronically Signed   By: Lorenza Cambridge M.D.   On: 06/11/2023 14:35    Pending Labs Unresulted Labs (From admission, onward)     Start     Ordered   06/12/23 0500  Lipid panel  (Labs)  Tomorrow morning,   R       Comments: Fasting    06/11/23 1500   06/12/23 0500  Basic metabolic panel  Tomorrow morning,   R        06/11/23 1603   06/12/23 0500  CBC  Tomorrow morning,   R        06/11/23 1603   06/11/23 1603  HIV Antibody (routine testing w rflx)  (HIV Antibody (Routine testing w reflex) panel)  Once,   R        06/11/23 1603   06/11/23 1500  Hemoglobin A1c  (Labs)  Add-on,   AD       Comments: To assess prior glycemic control    06/11/23 1500            Vitals/Pain Today's Vitals   06/11/23 1436 06/11/23 1441 06/11/23 1500 06/11/23 1515  BP: 139/85  (!) 144/92 (!) 139/100  Pulse: 81  80 82  Resp: 19  20 18   Temp: 97.8 F (36.6 C)     TempSrc: Oral     SpO2: 98%  99% 98%  Weight:  77.1 kg    Height:  5\' 10"  (1.778 m)    PainSc:  0-No pain      Isolation Precautions No active isolations  Medications Medications  clopidogrel (PLAVIX) tablet 300 mg (  300 mg Oral Given 06/11/23 1549)    Followed by  clopidogrel (PLAVIX) tablet 75 mg (has no administration in time range)  aspirin chewable tablet 81 mg (has no administration in time range)   stroke: early stages of recovery book (has no administration in time range)  atorvastatin (LIPITOR) tablet 40 mg (40 mg Oral Given 06/11/23 1849)  enoxaparin (LOVENOX) injection 40 mg (has no administration in time range)  acetaminophen (TYLENOL) tablet 650 mg (has no administration in time range)    Or  acetaminophen (TYLENOL) suppository 650 mg (has no administration in time range)  traZODone (DESYREL) tablet 25 mg (has no administration in time range)  ondansetron (ZOFRAN) tablet 4 mg (has no administration in time range)    Or   ondansetron (ZOFRAN) injection 4 mg (has no administration in time range)  albuterol (PROVENTIL) (2.5 MG/3ML) 0.083% nebulizer solution 2.5 mg (has no administration in time range)  iohexol (OMNIPAQUE) 350 MG/ML injection 75 mL (75 mLs Intravenous Contrast Given 06/11/23 1451)    Mobility walks     Focused Assessments     R Recommendations: See Admitting Provider Note  Report given to:   Additional Notes:

## 2023-06-11 NOTE — ED Provider Notes (Signed)
[place] EMERGENCY DEPARTMENT AT [facility] Provider Note   CSN: [id number] Arrival date & time: 06/11/23  1353     History  Chief Complaint  Patient presents with   Code Stroke    Peter Leblanc is a 67 y.o. male.  Patient is a 67 year old male with a past medical history of hypertension, hyperlipidemia, prior TIA presenting to the emergency department with left-sided paresthesias and left-sided facial droop.  The patient states that he was at work around 1 PM talking to friends that known him for the last 20 years when they noted that the left side of his face was drooping when he was talking.  He states that he also started to develop some tingling in the left side of his face and left arm.  He denied any weakness in his left arm or leg.  He denies any headache.  The history is provided by the patient.       Home Medications Prior to Admission medications   Medication Sig Start Date End Date Taking? Authorizing Provider  aspirin EC 81 MG tablet Take 81 mg by mouth daily.    [provider]  atorvastatin (LIPITOR) 40 MG tablet Take 1 tablet (40 mg total) by mouth daily. 05/18/23 08/16/23  Nahser, Deloris Ping, MD  celecoxib (CELEBREX) 200 MG capsule Take 1 capsule (200 mg total) by mouth 2 (two) times daily. 05/06/22   Corwin Levins, MD  CIALIS 5 MG tablet Take 1 tablet (5 mg total) by mouth daily. 05/02/21   Corwin Levins, MD  finasteride (PROSCAR) 5 MG tablet Take 5 mg by mouth daily. 03/31/22   [provider]  losartan (COZAAR) 50 MG tablet Take 1 tablet (50 mg total) by mouth daily. 05/06/22   Corwin Levins, MD  tamsulosin (FLOMAX) 0.4 MG CAPS capsule TAKE 1 CAPSULE BY MOUTH TWICE A DAY 05/06/22   Corwin Levins, MD  TURMERIC PO Take by mouth.    [provider]      Allergies    Patient has no known allergies.    Review of Systems   Review of Systems  Physical Exam Updated Vital Signs BP 139/85 (BP Location: Left Arm)   Pulse 81    Temp 97.8 F (36.6 C) (Oral)   Resp 19   Ht 5\' 10"  (1.778 m)   Wt 77.1 kg   SpO2 98%   BMI 24.39 kg/m  Physical Exam Vitals and nursing note reviewed.  Constitutional:      General: He is not in acute distress.    Appearance: Normal appearance.  HENT:     Head: Normocephalic and atraumatic.     Nose: Nose normal.     Mouth/Throat:     Mouth: Mucous membranes are moist.     Pharynx: Oropharynx is clear.  Eyes:     Extraocular Movements: Extraocular movements intact.     Conjunctiva/sclera: Conjunctivae normal.     Pupils: Pupils are equal, round, and reactive to light.  Cardiovascular:     Rate and Rhythm: Normal rate and regular rhythm.     Heart sounds: Normal heart sounds.  Pulmonary:     Effort: Pulmonary effort is normal.     Breath sounds: Normal breath sounds.  Abdominal:     General: Abdomen is flat.     Palpations: Abdomen is soft.     Tenderness: There is no abdominal tenderness.  Musculoskeletal:        General: Normal range of  motion.     Cervical back: Normal range of motion.  Skin:    General: Skin is warm and dry.  Neurological:     Mental Status: He is alert and oriented to person, place, and time.     Comments: Possible mild L-sided facial droop Sensation intact in face and all 4 extremities 5/5 bilateral grip strength No drift in all 4 extremities Normal finger to nose bilaterally Normal speech  Psychiatric:        Mood and Affect: Mood normal.        Behavior: Behavior normal.     ED Results / Procedures / Treatments   Labs (all labs ordered are listed, but only abnormal results are displayed) Labs Reviewed  CBC - Abnormal; Notable for the following components:      Result Value   MCH 34.4 (*)    All other components within normal limits  COMPREHENSIVE METABOLIC PANEL - Abnormal; Notable for the following components:   Glucose, Bld 114 (*)    All other components within normal limits  ETHANOL  PROTIME-INR  APTT  DIFFERENTIAL   RAPID URINE DRUG SCREEN, HOSP PERFORMED  URINALYSIS, ROUTINE W REFLEX MICROSCOPIC  HEMOGLOBIN A1C  I-STAT CHEM 8, ED    EKG None  Radiology CT Angio Head Neck W WO CM (CODE STROKE) Result Date: 06/11/2023 CLINICAL DATA:  Neuro deficit, acute, stroke suspected EXAM: CT ANGIOGRAPHY HEAD AND NECK WITH AND WITHOUT CONTRAST TECHNIQUE: Multidetector CT imaging of the head and neck was performed using the standard protocol during bolus administration of intravenous contrast. Multiplanar CT image reconstructions and MIPs were obtained to evaluate the vascular anatomy. Carotid stenosis measurements (when applicable) are obtained utilizing NASCET criteria, using the distal internal carotid diameter as the denominator. RADIATION DOSE REDUCTION: This exam was performed according to the departmental dose-optimization program which includes automated exposure control, adjustment of the mA and/or kV according to patient size and/or use of iterative reconstruction technique. CONTRAST:  75mL OMNIPAQUE IOHEXOL 350 MG/ML SOLN COMPARISON:  Same-day head CT.  CT neck 05/20/2020 FINDINGS: CT HEAD FINDINGS See same day CT head for intracranial findings CTA NECK FINDINGS Aortic arch: Standard branching. Imaged portion shows no evidence of aneurysm or dissection. No significant stenosis of the major arch vessel origins. Right carotid system: No evidence of dissection, stenosis (50% or greater), or occlusion. Left carotid system: No evidence of dissection, stenosis (50% or greater), or occlusion. Vertebral arteries: Left dominant. No evidence of dissection, stenosis (50% or greater), or occlusion. Skeleton: Negative. Other neck: Negative. Upper chest: Negative. Review of the MIP images confirms the above findings CTA HEAD FINDINGS Anterior circulation: No significant stenosis, proximal occlusion, aneurysm, or vascular malformation. Posterior circulation: No significant stenosis, proximal occlusion, aneurysm, or vascular  malformation. Venous sinuses: As permitted by contrast timing, patent. Anatomic variants: None Review of the MIP images confirms the above findings IMPRESSION: 1. No intracranial large vessel occlusion or significant stenosis. 2. No hemodynamically significant stenosis in the neck. Electronically Signed   By: Lorenza Cambridge M.D.   On: 06/11/2023 15:05   CT HEAD CODE STROKE WO CONTRAST Result Date: 06/11/2023 CLINICAL DATA:  Code stroke.  Left arm tingling EXAM: CT HEAD WITHOUT CONTRAST TECHNIQUE: Contiguous axial images were obtained from the base of the skull through the vertex without intravenous contrast. RADIATION DOSE REDUCTION: This exam was performed according to the departmental dose-optimization program which includes automated exposure control, adjustment of the mA and/or kV according to patient size and/or use of iterative reconstruction  technique. COMPARISON:  Brain MR 12/15/15 FINDINGS: Brain: No hemorrhage. No hydrocephalus. No extra-axial fluid collection. There is a background of mild chronic microvascular ischemic change. No CT evidence of an acute cortical infarct. No mass effect. No mass lesion. Vascular: Somewhat hyperdense appearance of the tip of the basilar artery is most likely artifactual. Skull: No focal lesion. Likely chronic comminuted nasal bone fractures. Sinuses/Orbits: No middle ear or mastoid effusion. Paranasal sinuses are notable for mucosal thickening in the left maxillary sinus. Orbits are unremarkable. Other: None. ASPECTS (Alberta Stroke Program Early CT Score): 10 IMPRESSION: 1. No hemorrhage or CT evidence of an acute cortical infarct. 2. Somewhat hyperdense appearance of the tip of the basilar artery is most likely artifactual. If there is clinical concern for a basilar artery thrombosis, consider further evaluation with a CTA. Findings were discussed with Dr. Greta Doom on 06/11/23 at 2:34 PM. Electronically Signed   By: Lorenza Cambridge M.D.   On: 06/11/2023 14:35     Procedures .Critical Care  Performed by: Rexford Maus, DO Authorized by: Rexford Maus, DO   Critical care provider statement:    Critical care time (minutes):  30   Critical care was necessary to treat or prevent imminent or life-threatening deterioration of the following conditions:  CNS failure or compromise   Critical care was time spent personally by me on the following activities:  Development of treatment plan with patient or surrogate, discussions with consultants, evaluation of patient's response to treatment, examination of patient, ordering and review of laboratory studies, ordering and review of radiographic studies, ordering and performing treatments and interventions, pulse oximetry, re-evaluation of patient's condition and review of old charts     Medications Ordered in ED Medications  clopidogrel (PLAVIX) tablet 300 mg (has no administration in time range)    Followed by  clopidogrel (PLAVIX) tablet 75 mg (has no administration in time range)  aspirin chewable tablet 81 mg (has no administration in time range)   stroke: early stages of recovery book (has no administration in time range)  iohexol (OMNIPAQUE) 350 MG/ML injection 75 mL (75 mLs Intravenous Contrast Given 06/11/23 1451)    ED Course/ Medical Decision Making/ A&P Clinical Course as of 06/11/23 1536  Fri Jun 11, 2023  1435 Received call from [facility], no obvious ICH or CVA but nonspecific changes and recommended CTA. [VK]  1457 Evaluated by neurology, suspect TIA vs small stroke. Recommended admission for MRI and TIA work up.  [VK]    Clinical Course User Index [VK] Rexford Maus, DO                                 Medical Decision Making This patient presents to the ED with chief complaint(s) of L-sided facial droop/parasthesias with pertinent past medical history of HTN, HLD, TIA which further complicates the presenting complaint. The complaint involves an extensive differential  diagnosis and also carries with it a high risk of complications and morbidity.    The differential diagnosis includes TIA, CVA, ICH, mass effect, electrolyte abnormality, hypo or hyperglycemic crisis  Additional history obtained: Additional history obtained from N/A Records reviewed Primary Care Documents and outpatient cardiology records  ED Course and Reassessment: On patient's arrival he is hemodynamically stable in no acute distress.  I was asked to assess him in triage with concern for possible CVA.  Patient had possibly mild left-sided facial droop with subjectively decreased sensation in the left  face and left arm.  Last known well was 1 PM and stroke alert was initiated.  Patient was immediately brought back to CT scanner and IV access was obtained.  Teleneurology was consulted.  Independent labs interpretation:  The following labs were independently interpreted: within normal range  Independent visualization of imaging: - I independently visualized the following imaging with scope of interpretation limited to determining acute life threatening conditions related to emergency care: CTH/CTA, which revealed no ICH/mass effect or obvious large ischemia, no significant stenosis  Consultation: - Consulted or discussed management/test interpretation w/ external professional: neurology, hospitalist  Consideration for admission or further workup: patient requires admission for further TIA/CVA work up  Social Determinants of health: N/A    Amount and/or Complexity of Data Reviewed Radiology: ordered.  Risk Prescription drug management. Decision regarding hospitalization.          Final Clinical Impression(s) / ED Diagnoses Final diagnoses:  TIA (transient ischemic attack)    Rx / DC Orders ED Discharge Orders     None         Rexford Maus, DO 06/11/23 1536

## 2023-06-11 NOTE — ED Notes (Signed)
Pt expressing desire to leave despite being up for admission. Provider came to speak with pt regarding admission, pt still expressing desire to leave.

## 2023-06-11 NOTE — ED Notes (Signed)
[facility] called for pt transport  

## 2023-06-11 NOTE — ED Notes (Signed)
Pt back from MRI 

## 2023-06-11 NOTE — ED Notes (Signed)
Pt states he is going to follow up with his primary provider on Monday & return to the ED if symptoms return or worsen.

## 2023-06-11 NOTE — H&P (Signed)
History and Physical  CYPRESS TINNES ZOX:[id number] DOB: December 24, 1955 DOA: 06/11/2023  PCP: Corwin Levins, MD   Chief Complaint: Left facial droop, left facial tingling  HPI: Peter Leblanc is a 67 y.o. male with medical history significant for BPH, hypertension, hyperlipidemia being admitted to the hospital with TIA.  Patient was in his usual state of health, went to work this morning and was speaking with coworkers know him well, they expressed concern that he may have a left facial droop.  He thought he was speaking clearly he did not have any symptoms, however after they mention the facial droop, he started to feel like maybe he had a little bit of left-sided facial tingling.  It lasted just for a few minutes, was gone by the time he got to the ER.  He was seen by cardiology recently for some palpitations, was supposed to get an outpatient echo but was not done yet.  Initial workup including head CT, and CT angiogram head and neck were unremarkable.  Here, he feels completely asymptomatic.  He was evaluated by teleneurology, who recommends hospital admission to [facility] for TIA workup.  Review of Systems: Please see HPI for pertinent positives and negatives. A complete 10 system review of systems are otherwise negative.  Past Medical History:  Diagnosis Date   ALLERGIC RHINITIS 02/15/2007   Allergy    Anxiety 08/23/2011   BPH (benign prostatic hypertrophy) with urinary obstruction 11/16/2012   CONJUNCTIVITIS, INFECTIOUS 09/04/2008   ERECTILE DYSFUNCTION 09/26/2008   Genital herpes    HTN (hypertension) 12/06/2015   HYPERLIPIDEMIA 11/08/2007   KNEE PAIN, BILATERAL 11/29/2009   PSA, INCREASED 11/08/2007   SKIN LESION 11/29/2009   Slowing of urinary stream 09/04/2008   TIA (transient ischemic attack) 12/06/2015   VULVAR ABSCESS 11/27/2009   Past Surgical History:  Procedure Laterality Date   COLONOSCOPY  12/02/2006   w/Brodie    GANGLION CYST EXCISION     NASAL SINUS SURGERY  1997   PROSTATE  BIOPSY     TONSILLECTOMY     VASECTOMY     Social History:  reports that he has never smoked. He has never used smokeless tobacco. He reports current alcohol use of about 14.0 standard drinks of alcohol per week. He reports that he does not use drugs.  No Known Allergies  Family History  Adopted: Yes  Family history unknown: Yes     Prior to Admission medications   Medication Sig Start Date End Date Taking? Authorizing Provider  aspirin EC 81 MG tablet Take 81 mg by mouth daily.    [provider]  atorvastatin (LIPITOR) 40 MG tablet Take 1 tablet (40 mg total) by mouth daily. 05/18/23 08/16/23  Nahser, Deloris Ping, MD  celecoxib (CELEBREX) 200 MG capsule Take 1 capsule (200 mg total) by mouth 2 (two) times daily. 05/06/22   Corwin Levins, MD  CIALIS 5 MG tablet Take 1 tablet (5 mg total) by mouth daily. 05/02/21   Corwin Levins, MD  finasteride (PROSCAR) 5 MG tablet Take 5 mg by mouth daily. 03/31/22   [provider]  losartan (COZAAR) 50 MG tablet Take 1 tablet (50 mg total) by mouth daily. 05/06/22   Corwin Levins, MD  tamsulosin (FLOMAX) 0.4 MG CAPS capsule TAKE 1 CAPSULE BY MOUTH TWICE A DAY 05/06/22   Corwin Levins, MD  TURMERIC PO Take by mouth.    [provider]    Physical Exam: BP 139/85 (BP Location: Left  Arm)   Pulse 81   Temp 97.8 F (36.6 C) (Oral)   Resp 19   Ht 5\' 10"  (1.778 m)   Wt 77.1 kg   SpO2 98%   BMI 24.39 kg/m  General:  Alert, oriented, calm, in no acute distress  Eyes: EOMI, clear conjuctivae, white sclerea Neck: supple, no masses, trachea mildline  Cardiovascular: RRR, no murmurs or rubs, no peripheral edema  Respiratory: clear to auscultation bilaterally, no wheezes, no crackles  Abdomen: soft, nontender, nondistended, normal bowel tones heard  Skin: dry, no rashes  Musculoskeletal: no joint effusions, normal range of motion  Psychiatric: appropriate affect, normal speech  Neurologic: extraocular muscles intact, clear  speech, moving all extremities with intact sensorium         Labs on Admission:  Basic Metabolic Panel: Recent Labs  Lab 06/11/23 1416  NA 137  K 3.8  CL 103  CO2 26  GLUCOSE 114*  BUN 22  CREATININE 0.80  CALCIUM 8.9   Liver Function Tests: Recent Labs  Lab 06/11/23 1416  AST 34  ALT 29  ALKPHOS 47  BILITOT 0.9  PROT 7.3  ALBUMIN 4.4   No results for input(s): "LIPASE", "AMYLASE" in the last 168 hours. No results for input(s): "AMMONIA" in the last 168 hours. CBC: Recent Labs  Lab 06/11/23 1416  WBC 7.6  NEUTROABS 5.1  HGB 15.0  HCT 43.1  MCV 98.9  PLT 231   Cardiac Enzymes: No results for input(s): "CKTOTAL", "CKMB", "CKMBINDEX", "TROPONINI" in the last 168 hours. BNP (last 3 results) No results for input(s): "BNP" in the last 8760 hours.  ProBNP (last 3 results) No results for input(s): "PROBNP" in the last 8760 hours.  CBG: No results for input(s): "GLUCAP" in the last 168 hours.  Radiological Exams on Admission: CT Angio Head Neck W WO CM (CODE STROKE) Result Date: 06/11/2023 CLINICAL DATA:  Neuro deficit, acute, stroke suspected EXAM: CT ANGIOGRAPHY HEAD AND NECK WITH AND WITHOUT CONTRAST TECHNIQUE: Multidetector CT imaging of the head and neck was performed using the standard protocol during bolus administration of intravenous contrast. Multiplanar CT image reconstructions and MIPs were obtained to evaluate the vascular anatomy. Carotid stenosis measurements (when applicable) are obtained utilizing NASCET criteria, using the distal internal carotid diameter as the denominator. RADIATION DOSE REDUCTION: This exam was performed according to the departmental dose-optimization program which includes automated exposure control, adjustment of the mA and/or kV according to patient size and/or use of iterative reconstruction technique. CONTRAST:  75mL OMNIPAQUE IOHEXOL 350 MG/ML SOLN COMPARISON:  Same-day head CT.  CT neck 05/20/2020 FINDINGS: CT HEAD FINDINGS  See same day CT head for intracranial findings CTA NECK FINDINGS Aortic arch: Standard branching. Imaged portion shows no evidence of aneurysm or dissection. No significant stenosis of the major arch vessel origins. Right carotid system: No evidence of dissection, stenosis (50% or greater), or occlusion. Left carotid system: No evidence of dissection, stenosis (50% or greater), or occlusion. Vertebral arteries: Left dominant. No evidence of dissection, stenosis (50% or greater), or occlusion. Skeleton: Negative. Other neck: Negative. Upper chest: Negative. Review of the MIP images confirms the above findings CTA HEAD FINDINGS Anterior circulation: No significant stenosis, proximal occlusion, aneurysm, or vascular malformation. Posterior circulation: No significant stenosis, proximal occlusion, aneurysm, or vascular malformation. Venous sinuses: As permitted by contrast timing, patent. Anatomic variants: None Review of the MIP images confirms the above findings IMPRESSION: 1. No intracranial large vessel occlusion or significant stenosis. 2. No hemodynamically significant stenosis in the  neck. Electronically Signed   By: Lorenza Cambridge M.D.   On: 06/11/2023 15:05   CT HEAD CODE STROKE WO CONTRAST Result Date: 06/11/2023 CLINICAL DATA:  Code stroke.  Left arm tingling EXAM: CT HEAD WITHOUT CONTRAST TECHNIQUE: Contiguous axial images were obtained from the base of the skull through the vertex without intravenous contrast. RADIATION DOSE REDUCTION: This exam was performed according to the departmental dose-optimization program which includes automated exposure control, adjustment of the mA and/or kV according to patient size and/or use of iterative reconstruction technique. COMPARISON:  Brain MR 12/15/15 FINDINGS: Brain: No hemorrhage. No hydrocephalus. No extra-axial fluid collection. There is a background of mild chronic microvascular ischemic change. No CT evidence of an acute cortical infarct. No mass effect. No  mass lesion. Vascular: Somewhat hyperdense appearance of the tip of the basilar artery is most likely artifactual. Skull: No focal lesion. Likely chronic comminuted nasal bone fractures. Sinuses/Orbits: No middle ear or mastoid effusion. Paranasal sinuses are notable for mucosal thickening in the left maxillary sinus. Orbits are unremarkable. Other: None. ASPECTS (Alberta Stroke Program Early CT Score): 10 IMPRESSION: 1. No hemorrhage or CT evidence of an acute cortical infarct. 2. Somewhat hyperdense appearance of the tip of the basilar artery is most likely artifactual. If there is clinical concern for a basilar artery thrombosis, consider further evaluation with a CTA. Findings were discussed with Dr. Greta Doom on 06/11/23 at 2:34 PM. Electronically Signed   By: Lorenza Cambridge M.D.   On: 06/11/2023 14:35   Assessment/Plan Peter Leblanc is a 67 y.o. male with medical history significant for BPH, hypertension, hyperlipidemia being admitted to the hospital with TIA.   TIA-symptoms have completely resolved, possible small stroke.  He has risk factors including hypertension, hyperlipidemia. -Observation admission to [facility] -Continue telemetry -Continue aspirin 81 mg p.o. daily -Check hemoglobin A1c, and fasting lipid panel -Plavix load and daily Plavix per neurology -PT/OT/SLP consults not recommended by neurology -Permissive hypertension for the next 48 hours, not needed if MR brain is normal -Check 2D echo -Check MRI brain  Hypertension-hold home antihypertensives, allow for permissive hypertension for the next 48 hours as above  Hyperlipidemia-continue statin, check fasting lipid panel  DVT prophylaxis: Lovenox     Code Status: Full Code  Consults called: Neurology  Admission status: Observation  Time spent: 46 minutes  Kaylon Hitz Sharlette Dense MD [facility] Pager [phone]  If 7PM-7AM, please contact night-coverage [email or web address] Password [email or web address]  06/11/2023, 4:05 PM

## 2023-06-11 NOTE — ED Triage Notes (Signed)
PT states he was at lunch with some friends around 1pm today when one of his friends told him that his left side of face had a droop.States his left arm was also tingling. Symptoms have subsided now that he has arrives. VSS, NAD

## 2023-06-11 NOTE — ED Notes (Signed)
Provider at bedside discussing risks of leaving AMA.

## 2023-06-11 NOTE — Progress Notes (Signed)
      2000- Notified by RN that pt wishes to leave AMA.   2005- I went to bedside to talk to patient regarding his decision to leave AMA.  He states that he has personal matters to take care of and that he feels "fine, back to normal."  During our conversation, I mentioned multiple times that neurology had recommended hospital admission for TIA workup. He also understands that there is an increased risk for stroke after TIA.  I have recommended multiple times that he remain admitted, however patient is adamant about leaving tonight.    Against Medical Advice  Peter Leblanc is A/Ox4, expresses desire to leave the Hospital immediately. Patient has been warned that this is not medically advisable at this time, and can result in medical complications like Death and Disability. Patient understands and accepts the risks involved and assumes full responsibilty of this decision.   Per patient, his wife will be picking him up.  This patient has been advised that if they feel the need for further medical assistance to return to any available ER or dial [phone].  Patient is agreeable to signing AMA form.     Anthoney Harada, DNP, ACNPC- AG [facility]

## 2023-06-11 NOTE — ED Notes (Signed)
First attempt to call report to RN at [facility]. No answer. Will call back

## 2023-06-11 NOTE — Progress Notes (Signed)
Code stroke cart activated at 1423. Dr. Iver Nestle paged at 1424.  Dr. Iver Nestle on screen at 1426. Pt back from CT at 1427.    Ricci Barker, Tele Stroke RN

## 2023-06-12 NOTE — Discharge Summary (Signed)
Discharge Summary  Peter Leblanc TDH:[id number] DOB: 07/15/1955  PCP: Corwin Levins, MD  Admit date: 06/11/2023 Discharge date: 06/11/2023  Discharge Diagnoses:  Active Hospital Problems   Diagnosis Date Noted   TIA (transient ischemic attack) 12/06/2015    Resolved Hospital Problems  No resolved problems to display.   Discharge Condition: AMA  HPI and Brief Hospital Course:  Pleasant 67 year old male with history of BPH, hypertension, hyperlipidemia admitted to the hospital for workup of TIA.  Left-sided facial droop, and numbness and tingling resolved by the time he was evaluated in the ER.  MRI brain was unremarkable.  Seen by teleneurology, who recommended hospital admission for further workup.  Patient left AMA prior to further workup.  Discharge Instructions None, left AMA.   Allergies as of 06/11/2023   No Known Allergies      Medication List     ASK your doctor about these medications    aspirin EC 81 MG tablet Take 81 mg by mouth 3 (three) times a week.   atorvastatin 40 MG tablet Commonly known as: LIPITOR Take 1 tablet (40 mg total) by mouth daily.   celecoxib 200 MG capsule Commonly known as: CeleBREX Take 1 capsule (200 mg total) by mouth 2 (two) times daily.   Cialis 5 MG tablet Generic drug: tadalafil Take 1 tablet (5 mg total) by mouth daily.   finasteride 5 MG tablet Commonly known as: PROSCAR Take 5 mg by mouth at bedtime.   losartan 50 MG tablet Commonly known as: COZAAR Take 1 tablet (50 mg total) by mouth daily.   NON FORMULARY Take 1-2 g by mouth See admin instructions. Eat 1-2 grams of freshly-ground black pepper in the morning and evening   tamsulosin 0.4 MG Caps capsule Commonly known as: FLOMAX TAKE 1 CAPSULE BY MOUTH TWICE A DAY   TURMERIC PO Take 1 capsule by mouth See admin instructions. Take 1 capsule by mouth in the morning and evening       No Known Allergies   The results of significant diagnostics from this  hospitalization (including imaging, microbiology, ancillary and laboratory) are listed below for reference.    Significant Diagnostic Studies: MR BRAIN WO CONTRAST Result Date: 06/11/2023 CLINICAL DATA:  Weakness, facial droop, stroke suspected EXAM: MRI HEAD WITHOUT CONTRAST TECHNIQUE: Multiplanar, multiecho pulse sequences of the brain and surrounding structures were obtained without intravenous contrast. COMPARISON:  12/15/2015 MRI head FINDINGS: Brain: No restricted diffusion to suggest acute or subacute infarct. No acute hemorrhage, mass, mass effect, or midline shift. No hydrocephalus or extra-axial collection. Pituitary and craniocervical junction within normal limits. No hemosiderin deposition to suggest remote hemorrhage. Normal cerebral volume for age. Scattered T2 hyperintense signal in the periventricular white matter, likely the sequela of mild-to-moderate chronic small vessel ischemic disease. No evidence of remote cortical or lacunar infarct. Vascular: Normal arterial flow voids. Skull and upper cervical spine: Normal marrow signal. Sinuses/Orbits: Clear paranasal sinuses. No acute finding in the orbits. Other: Trace fluid in the bilateral mastoid air cells. IMPRESSION: No acute intracranial process. No evidence of acute or subacute infarct. Electronically Signed   By: Wiliam Ke M.D.   On: 06/11/2023 18:16   CT Angio Head Neck W WO CM (CODE STROKE) Result Date: 06/11/2023 CLINICAL DATA:  Neuro deficit, acute, stroke suspected EXAM: CT ANGIOGRAPHY HEAD AND NECK WITH AND WITHOUT CONTRAST TECHNIQUE: Multidetector CT imaging of the head and neck was performed using the standard protocol during bolus administration of intravenous contrast. Multiplanar CT image reconstructions  and MIPs were obtained to evaluate the vascular anatomy. Carotid stenosis measurements (when applicable) are obtained utilizing NASCET criteria, using the distal internal carotid diameter as the denominator. RADIATION  DOSE REDUCTION: This exam was performed according to the departmental dose-optimization program which includes automated exposure control, adjustment of the mA and/or kV according to patient size and/or use of iterative reconstruction technique. CONTRAST:  75mL OMNIPAQUE IOHEXOL 350 MG/ML SOLN COMPARISON:  Same-day head CT.  CT neck 05/20/2020 FINDINGS: CT HEAD FINDINGS See same day CT head for intracranial findings CTA NECK FINDINGS Aortic arch: Standard branching. Imaged portion shows no evidence of aneurysm or dissection. No significant stenosis of the major arch vessel origins. Right carotid system: No evidence of dissection, stenosis (50% or greater), or occlusion. Left carotid system: No evidence of dissection, stenosis (50% or greater), or occlusion. Vertebral arteries: Left dominant. No evidence of dissection, stenosis (50% or greater), or occlusion. Skeleton: Negative. Other neck: Negative. Upper chest: Negative. Review of the MIP images confirms the above findings CTA HEAD FINDINGS Anterior circulation: No significant stenosis, proximal occlusion, aneurysm, or vascular malformation. Posterior circulation: No significant stenosis, proximal occlusion, aneurysm, or vascular malformation. Venous sinuses: As permitted by contrast timing, patent. Anatomic variants: None Review of the MIP images confirms the above findings IMPRESSION: 1. No intracranial large vessel occlusion or significant stenosis. 2. No hemodynamically significant stenosis in the neck. Electronically Signed   By: Lorenza Cambridge M.D.   On: 06/11/2023 15:05   CT HEAD CODE STROKE WO CONTRAST Result Date: 06/11/2023 CLINICAL DATA:  Code stroke.  Left arm tingling EXAM: CT HEAD WITHOUT CONTRAST TECHNIQUE: Contiguous axial images were obtained from the base of the skull through the vertex without intravenous contrast. RADIATION DOSE REDUCTION: This exam was performed according to the departmental dose-optimization program which includes automated  exposure control, adjustment of the mA and/or kV according to patient size and/or use of iterative reconstruction technique. COMPARISON:  Brain MR 12/15/15 FINDINGS: Brain: No hemorrhage. No hydrocephalus. No extra-axial fluid collection. There is a background of mild chronic microvascular ischemic change. No CT evidence of an acute cortical infarct. No mass effect. No mass lesion. Vascular: Somewhat hyperdense appearance of the tip of the basilar artery is most likely artifactual. Skull: No focal lesion. Likely chronic comminuted nasal bone fractures. Sinuses/Orbits: No middle ear or mastoid effusion. Paranasal sinuses are notable for mucosal thickening in the left maxillary sinus. Orbits are unremarkable. Other: None. ASPECTS ([place]berta Stroke Program Early CT Score): 10 IMPRESSION: 1. No hemorrhage or CT evidence of an acute cortical infarct. 2. Somewhat hyperdense appearance of the tip of the basilar artery is most likely artifactual. If there is clinical concern for a basilar artery thrombosis, consider further evaluation with a CTA. Findings were discussed with Dr. Greta Doom on 06/11/23 at 2:34 PM. Electronically Signed   By: Lorenza Cambridge M.D.   On: 06/11/2023 14:35    Microbiology: No results found for this or any previous visit (from the past 240 hours).   Labs: Basic Metabolic Panel: Recent Labs  Lab 06/11/23 1416 06/11/23 1451  NA 137 140  K 3.8 3.8  CL 103 104  CO2 26  --   GLUCOSE 114* 116*  BUN 22 21  CREATININE 0.80 0.90  CALCIUM 8.9  --    Liver Function Tests: Recent Labs  Lab 06/11/23 1416  AST 34  ALT 29  ALKPHOS 47  BILITOT 0.9  PROT 7.3  ALBUMIN 4.4   No results for input(s): "LIPASE", "AMYLASE" in the  last 168 hours. No results for input(s): "AMMONIA" in the last 168 hours. CBC: Recent Labs  Lab 06/11/23 1416 06/11/23 1451  WBC 7.6  --   NEUTROABS 5.1  --   HGB 15.0 14.3  HCT 43.1 42.0  MCV 98.9  --   PLT 231  --    Cardiac Enzymes: No results for  input(s): "CKTOTAL", "CKMB", "CKMBINDEX", "TROPONINI" in the last 168 hours. BNP: BNP (last 3 results) No results for input(s): "BNP" in the last 8760 hours.  ProBNP (last 3 results) No results for input(s): "PROBNP" in the last 8760 hours.  CBG: No results for input(s): "GLUCAP" in the last 168 hours.  Time spent: 5 minutes were spent in preparing this discharge including medication reconciliation, counseling, and coordination of care.  Signed:  Keiri Solano Sharlette Dense, MD  [facility] 06/12/2023, 7:36 AM

## 2023-06-24 ENCOUNTER — Ambulatory Visit (HOSPITAL_COMMUNITY): Payer: No Typology Code available for payment source | Attending: Cardiology

## 2023-06-24 DIAGNOSIS — I4510 Unspecified right bundle-branch block: Secondary | ICD-10-CM | POA: Diagnosis not present

## 2023-06-24 DIAGNOSIS — R931 Abnormal findings on diagnostic imaging of heart and coronary circulation: Secondary | ICD-10-CM | POA: Diagnosis present

## 2023-06-24 DIAGNOSIS — I10 Essential (primary) hypertension: Secondary | ICD-10-CM

## 2023-06-24 DIAGNOSIS — E782 Mixed hyperlipidemia: Secondary | ICD-10-CM | POA: Diagnosis not present

## 2023-06-24 LAB — ECHOCARDIOGRAM COMPLETE
Area-P 1/2: 4.36 cm2
S' Lateral: 2.3 cm

## 2023-07-09 ENCOUNTER — Other Ambulatory Visit: Payer: Self-pay | Admitting: Internal Medicine

## 2023-07-09 ENCOUNTER — Other Ambulatory Visit: Payer: Self-pay

## 2023-08-22 ENCOUNTER — Other Ambulatory Visit: Payer: Self-pay | Admitting: Internal Medicine

## 2023-08-23 ENCOUNTER — Encounter: Payer: Self-pay | Admitting: Cardiovascular Disease

## 2023-08-23 ENCOUNTER — Other Ambulatory Visit: Payer: Self-pay

## 2023-08-23 NOTE — Progress Notes (Unsigned)
 Cardiology Office Note:    Date:  08/24/2023   ID:  Peter Leblanc, DOB 07-Feb-1956, MRN [id number]  PCP:  Corwin Levins, MD   [facility] Providers Cardiologist:  Eugenia Pancoast to update primary MD,subspecialty MD or APP then REFRESH:1}    Referring MD: Corwin Levins, MD   Chief Complaint  Patient presents with   coronary calcification   Hypertension         Sept. 2, 20222    Peter Leblanc is a 68 y.o. male with a hx of palpitations. We were asked to see him by Dr. Jonny Ruiz for further evaluation of his palpitations.  6-8 months of heart racing Last for 5-10 min Feels weak initially and notices that his heart rate is faster than usual. Counts a HR of 90s Typically occurs late in the day.  Is not associated with eating or drinking. Works as a Games developer, walks quite a bit  Does not exercise  Is busy, but no real cardio  Nov. 26, 2024 Peter Leblanc is seen for follow up of his palpitations, HTN  He had  coronary calcium CT scan .  CAC score is 534 ( 82 percentile for age / sex matched controls )   Is having some sharp chest pain  Off and on for 2 weeks Overall his symptoms do not sound cardiac   Walks every day , 12, 000 steps a day  Not much cardio.  We talked about the benefits of getting regular cardio    August 23, 2023 Peter Leblanc is seen today for follow-up of his coronary artery calcifications. Walks on occasion about 2 miles  Advised him to walk 3-4 miles , 4-5 days a week .  Is tolerating his atorvastatin  His LDL is 69  Has occasional chest twinges  Not sustained      Past Medical History:  Diagnosis Date   ALLERGIC RHINITIS 02/15/2007   Allergy    Anxiety 08/23/2011   BPH (benign prostatic hypertrophy) with urinary obstruction 11/16/2012   CONJUNCTIVITIS, INFECTIOUS 09/04/2008   ERECTILE DYSFUNCTION 09/26/2008   Genital herpes    HTN (hypertension) 12/06/2015   HYPERLIPIDEMIA 11/08/2007   KNEE PAIN, BILATERAL 11/29/2009   PSA, INCREASED 11/08/2007   SKIN  LESION 11/29/2009   Slowing of urinary stream 09/04/2008   TIA (transient ischemic attack) 12/06/2015   VULVAR ABSCESS 11/27/2009    Past Surgical History:  Procedure Laterality Date   COLONOSCOPY  12/02/2006   w/Brodie    GANGLION CYST EXCISION     NASAL SINUS SURGERY  1997   PROSTATE BIOPSY     TONSILLECTOMY     VASECTOMY      Current Medications: Current Meds  Medication Sig   aspirin EC 81 MG tablet Take 81 mg by mouth 3 (three) times a week.   atorvastatin (LIPITOR) 40 MG tablet Take 1 tablet (40 mg total) by mouth daily. (Patient taking differently: Take 40 mg by mouth at bedtime.)   celecoxib (CELEBREX) 200 MG capsule Take 1 capsule (200 mg total) by mouth 2 (two) times daily.   CIALIS 5 MG tablet Take 1 tablet (5 mg total) by mouth daily. (Patient taking differently: Take 5 mg by mouth at bedtime.)   finasteride (PROSCAR) 5 MG tablet Take 5 mg by mouth at bedtime.   losartan (COZAAR) 50 MG tablet Take 1 tablet (50 mg total) by mouth daily.   NON FORMULARY Take 1-2 g by mouth See admin instructions. Eat 1-2 grams of freshly-ground  black pepper in the morning and evening   tamsulosin (FLOMAX) 0.4 MG CAPS capsule TAKE 1 CAPSULE BY MOUTH TWICE A DAY (Patient taking differently: Take 0.4 mg by mouth in the morning and at bedtime.)   TURMERIC PO Take 1 capsule by mouth See admin instructions. Take 1 capsule by mouth in the morning and evening     Allergies:   Patient has no known allergies.   Social History   Socioeconomic History   Marital status: Married    Spouse name: Not on file   Number of children: Not on file   Years of education: Not on file   Highest education level: Not on file  Occupational History   Not on file  Tobacco Use   Smoking status: Never   Smokeless tobacco: Never  Vaping Use   Vaping status: Never Used  Substance and Sexual Activity   Alcohol use: Yes    Alcohol/week: 14.0 standard drinks of alcohol    Types: 14 Cans of beer per week   Drug  use: No   Sexual activity: Not on file  Other Topics Concern   Not on file  Social History Narrative   Not on file   Social Drivers of Health   Financial Resource Strain: Not on file  Food Insecurity: Not on file  Transportation Needs: Not on file  Physical Activity: Not on file  Stress: Not on file  Social Connections: Not on file     Family History: The patient's He was adopted. Family history is unknown by patient.  ROS:   Please see the history of present illness.     All other systems reviewed and are negative.  EKGs/Labs/Other Studies Reviewed:    The following studies were reviewed today:   EKG:       NSR , RBBB     Recent Labs: 01/29/2023: TSH 1.18 06/11/2023: ALT 29; BUN 21; Creatinine, Ser 0.90; Hemoglobin 14.3; Platelets 231; Potassium 3.8; Sodium 140  Recent Lipid Panel    Component Value Date/Time   CHOL 144 01/29/2023 1051   TRIG 70.0 01/29/2023 1051   HDL 61.30 01/29/2023 1051   CHOLHDL 2 01/29/2023 1051   VLDL 14.0 01/29/2023 1051   LDLCALC 69 01/29/2023 1051   LDLDIRECT 86.0 12/04/2020 1638     Risk Assessment/Calculations:           Physical Exam:    Physical Exam: Blood pressure 118/74, pulse 86, height 5\' 10"  (1.778 m), weight 179 lb 12.8 oz (81.6 kg), SpO2 96%.       GEN:  Well nourished, well developed in no acute distress HEENT: Normal NECK: No JVD; No carotid bruits LYMPHATICS: No lymphadenopathy CARDIAC: RRR , no murmurs, rubs, gallops RESPIRATORY:  Clear to auscultation without rales, wheezing or rhonchi  ABDOMEN: Soft, non-tender, non-distended MUSCULOSKELETAL:  No edema; No deformity  SKIN: Warm and dry NEUROLOGIC:  Alert and oriented x 3   ASSESSMENT:    1. RBBB (right bundle branch block)   2. Coronary artery calcification      PLAN:      Coronary artery calcifications: Leviticus has a high coronary calcium score -534- which places him in the 82nd percentile for age and sex matched controls. His last LDL is  69.  Goal is < 70    2.  RBBB :   echo shows normal LV function  He has CAD  Will get a [employer] for further evaluation  .      Medication Adjustments/Labs and  Tests Ordered: Current medicines are reviewed at length with the patient today.  Concerns regarding medicines are outlined above.  Orders Placed This Encounter  Procedures   MYOCARDIAL PERFUSION IMAGING    No orders of the defined types were placed in this encounter.    Patient Instructions  Testing: Lexiscan Myoview stress test Your physician has requested that you have a lexiscan myoview. For further information please visit [email or web address] Please follow instruction sheet, as given.  Follow-Up: At [facility], you and your health needs are our priority.  As part of our continuing mission to provide you with exceptional heart care, we have created designated Provider Care Teams.  These Care Teams include your primary Cardiologist (physician) and Advanced Practice Providers (APPs -  Physician Assistants and Nurse Practitioners) who all work together to provide you with the care you need, when you need it.  Your next appointment:   1 year(s)  Provider:   Kristeen Miss, MD      1st Floor: - Lobby - Registration  - Pharmacy  - Lab - Cafe  2nd Floor: - PV Lab - Diagnostic Testing (echo, CT, nuclear med)  3rd Floor: - Vacant  4th Floor: - TCTS (cardiothoracic surgery) - AFib Clinic - Structural Heart Clinic - Vascular Surgery  - Vascular Ultrasound  5th Floor: - HeartCare Cardiology (general and EP) - Clinical Pharmacy for coumadin, hypertension, lipid, weight-loss medications, and med management appointments    Valet parking services will be available as well.     Signed, Kristeen Miss, MD  08/24/2023 7:58 PM    [facility]

## 2023-08-24 ENCOUNTER — Encounter (HOSPITAL_COMMUNITY): Payer: Self-pay

## 2023-08-24 ENCOUNTER — Ambulatory Visit: Payer: Self-pay | Attending: Cardiovascular Disease | Admitting: Cardiovascular Disease

## 2023-08-24 ENCOUNTER — Encounter: Payer: Self-pay | Admitting: Cardiovascular Disease

## 2023-08-24 VITALS — BP 118/74 | HR 86 | Ht 70.0 in | Wt 179.8 lb

## 2023-08-24 DIAGNOSIS — I4510 Unspecified right bundle-branch block: Secondary | ICD-10-CM

## 2023-08-24 DIAGNOSIS — I2510 Atherosclerotic heart disease of native coronary artery without angina pectoris: Secondary | ICD-10-CM

## 2023-08-24 NOTE — Patient Instructions (Addendum)
 Testing: Lexiscan Myoview stress test Your physician has requested that you have a lexiscan myoview. For further information please visit [email or web address] Please follow instruction sheet, as given.  Follow-Up: At [facility], you and your health needs are our priority.  As part of our continuing mission to provide you with exceptional heart care, we have created designated Provider Care Teams.  These Care Teams include your primary Cardiologist (physician) and Advanced Practice Providers (APPs -  Physician Assistants and Nurse Practitioners) who all work together to provide you with the care you need, when you need it.  Your next appointment:   1 year(s)  Provider:   Kristeen Miss, MD      1st Floor: - Lobby - Registration  - Pharmacy  - Lab - Cafe  2nd Floor: - PV Lab - Diagnostic Testing (echo, CT, nuclear med)  3rd Floor: - Vacant  4th Floor: - TCTS (cardiothoracic surgery) - AFib Clinic - Structural Heart Clinic - Vascular Surgery  - Vascular Ultrasound  5th Floor: - HeartCare Cardiology (general and EP) - Clinical Pharmacy for coumadin, hypertension, lipid, weight-loss medications, and med management appointments    Valet parking services will be available as well.

## 2023-08-30 ENCOUNTER — Telehealth (HOSPITAL_COMMUNITY): Payer: Self-pay | Admitting: *Deleted

## 2023-08-30 NOTE — Telephone Encounter (Signed)
 Patient given detailed instructions per Myocardial Perfusion Study Information Sheet for the test on 09/01/23 Patient notified to arrive 15 minutes early and that it is imperative to arrive on time for appointment to keep from having the test rescheduled.  If you need to cancel or reschedule your appointment, please call the office within 24 hours of your appointment. . Patient verbalized understanding. Ricky Ala

## 2023-09-01 ENCOUNTER — Ambulatory Visit (HOSPITAL_COMMUNITY)

## 2023-09-14 ENCOUNTER — Ambulatory Visit (HOSPITAL_COMMUNITY)

## 2023-09-21 ENCOUNTER — Ambulatory Visit (HOSPITAL_COMMUNITY): Payer: Self-pay | Attending: Cardiovascular Disease

## 2023-09-21 ENCOUNTER — Encounter: Payer: Self-pay | Admitting: Cardiovascular Disease

## 2023-09-21 DIAGNOSIS — I4510 Unspecified right bundle-branch block: Secondary | ICD-10-CM | POA: Diagnosis not present

## 2023-09-21 DIAGNOSIS — I2510 Atherosclerotic heart disease of native coronary artery without angina pectoris: Secondary | ICD-10-CM | POA: Diagnosis not present

## 2023-09-21 LAB — MYOCARDIAL PERFUSION IMAGING
LV dias vol: 108 mL (ref 62–150)
LV sys vol: 39 mL
Nuc Stress EF: 64 %
Peak HR: 99 {beats}/min
Rest HR: 71 {beats}/min
Rest Nuclear Isotope Dose: 10.9 mCi
SDS: 0
SRS: 0
SSS: 0
ST Depression (mm): 0 mm
Stress Nuclear Isotope Dose: 31.1 mCi
TID: 1.03

## 2023-09-21 MED ORDER — TECHNETIUM TC 99M TETROFOSMIN IV KIT
10.9000 | PACK | Freq: Once | INTRAVENOUS | Status: AC | PRN
  Administered 2023-09-21: 10.9 via INTRAVENOUS

## 2023-09-21 MED ORDER — REGADENOSON 0.4 MG/5ML IV SOLN
0.4000 mg | Freq: Once | INTRAVENOUS | Status: AC
  Administered 2023-09-21: 0.4 mg via INTRAVENOUS

## 2023-09-21 MED ORDER — TECHNETIUM TC 99M TETROFOSMIN IV KIT
31.1000 | PACK | Freq: Once | INTRAVENOUS | Status: AC | PRN
Start: 2023-09-21 — End: 2023-09-21
  Administered 2023-09-21: 31.1 via INTRAVENOUS

## 2024-03-20 ENCOUNTER — Ambulatory Visit: Payer: Self-pay

## 2024-03-20 ENCOUNTER — Encounter: Payer: Self-pay | Admitting: Family Medicine

## 2024-03-20 ENCOUNTER — Ambulatory Visit: Admitting: Family Medicine

## 2024-03-20 VITALS — BP 136/78 | HR 80 | Temp 98.4°F | Resp 18 | Ht 70.0 in | Wt 181.0 lb

## 2024-03-20 DIAGNOSIS — J988 Other specified respiratory disorders: Secondary | ICD-10-CM

## 2024-03-20 DIAGNOSIS — B9689 Other specified bacterial agents as the cause of diseases classified elsewhere: Secondary | ICD-10-CM | POA: Diagnosis not present

## 2024-03-20 MED ORDER — AMOXICILLIN-POT CLAVULANATE 875-125 MG PO TABS: 1.0000 | ORAL_TABLET | Freq: Two times a day (BID) | ORAL | 0 refills | Status: AC

## 2024-03-20 NOTE — Progress Notes (Signed)
 Assessment & Plan Bacterial respiratory infection Education provided on URI. Encouraged adequate hydration. Orders:   amoxicillin -clavulanate (AUGMENTIN ) 875-125 MG tablet; Take 1 tablet by mouth 2 (two) times daily for 7 days.    Follow up plan: Return if symptoms worsen or fail to improve.  [name], MSN, APRN, FNP-C  Subjective:  HPI: Peter Leblanc is a 68 y.o. male presenting on 03/20/2024 for Allergic Rhinitis  (10 days ago had a ST/ sinus, glands swollen, decreased appetite, diarrhea and fatigue/No cough, congestion - antihistamine not helping )  Discussed the use of AI scribe software for clinical note transcription with the patient, who gave verbal consent to proceed.  History of Present Illness   Peter Leblanc is a 68 year old male who presents with respiratory symptoms and fatigue persisting for ten days.  He has been experiencing respiratory symptoms including a sore throat, sinus issues, mild shortness of breath, and swollen glands for approximately ten days. Initially, the swelling was on the right side, then moved to the left, and at times affected both sides. Currently, the swelling is on the left side. He has been taking antihistamines without relief.  He also reports mild diarrhea that began three to four days after the onset of respiratory symptoms. There is no blood or mucus in the stool. He experiences nausea and a general sense of weakness and fatigue, describing himself as 'just tired'.     ROS: Negative unless specifically indicated above in HPI.   Relevant past medical history reviewed and updated as indicated.   Allergies and medications reviewed and updated.   Current Outpatient Medications:    amoxicillin -clavulanate (AUGMENTIN ) 875-125 MG tablet, Take 1 tablet by mouth 2 (two) times daily for 7 days., Disp: 14 tablet, Rfl: 0   aspirin  EC 81 MG tablet, Take 81 mg by mouth 3 (three) times a week., Disp: , Rfl:    atorvastatin  (LIPITOR) 40 MG  tablet, Take 1 tablet (40 mg total) by mouth daily. (Patient taking differently: Take 40 mg by mouth at bedtime.), Disp: 90 tablet, Rfl: 3   celecoxib  (CELEBREX ) 200 MG capsule, Take 1 capsule (200 mg total) by mouth 2 (two) times daily., Disp: 180 capsule, Rfl: 3   CIALIS  5 MG tablet, Take 1 tablet (5 mg total) by mouth daily. (Patient taking differently: Take 5 mg by mouth at bedtime.), Disp: 10 tablet, Rfl: 6   finasteride (PROSCAR) 5 MG tablet, Take 5 mg by mouth at bedtime., Disp: , Rfl:    losartan  (COZAAR ) 50 MG tablet, Take 1 tablet (50 mg total) by mouth daily., Disp: 90 tablet, Rfl: 3   NON FORMULARY, Take 1-2 g by mouth See admin instructions. Eat 1-2 grams of freshly-ground black pepper in the morning and evening, Disp: , Rfl:    tamsulosin  (FLOMAX ) 0.4 MG CAPS capsule, TAKE 1 CAPSULE BY MOUTH TWICE A DAY (Patient taking differently: Take 0.4 mg by mouth in the morning and at bedtime.), Disp: 180 capsule, Rfl: 3   TURMERIC PO, Take 1 capsule by mouth See admin instructions. Take 1 capsule by mouth in the morning and evening, Disp: , Rfl:   No Known Allergies  Objective:   BP 136/78   Pulse 80   Temp 98.4 F (36.9 C)   Resp 18   Ht 5' 10 (1.778 m)   Wt 181 lb (82.1 kg)   SpO2 96%   BMI 25.97 kg/m    Physical Exam Vitals reviewed.  Constitutional:  General: He is not in acute distress.    Appearance: Normal appearance. He is not ill-appearing, toxic-appearing or diaphoretic.  HENT:     Head: Normocephalic and atraumatic.     Right Ear: Tympanic membrane, ear canal and external ear normal. There is no impacted cerumen.     Left Ear: Tympanic membrane, ear canal and external ear normal. There is no impacted cerumen.     Nose: Nose normal.     Right Sinus: No maxillary sinus tenderness or frontal sinus tenderness.     Left Sinus: No maxillary sinus tenderness or frontal sinus tenderness.     Mouth/Throat:     Mouth: Mucous membranes are moist.     Pharynx: Oropharynx  is clear. No oropharyngeal exudate or posterior oropharyngeal erythema.     Tonsils: No tonsillar exudate or tonsillar abscesses.  Eyes:     General: No scleral icterus.       Right eye: No discharge.        Left eye: No discharge.     Conjunctiva/sclera: Conjunctivae normal.  Cardiovascular:     Rate and Rhythm: Normal rate.  Pulmonary:     Effort: Pulmonary effort is normal. No respiratory distress.  Musculoskeletal:        General: Normal range of motion.     Cervical back: Normal range of motion.  Lymphadenopathy:     Cervical: Cervical adenopathy present.  Skin:    General: Skin is warm and dry.  Neurological:     Mental Status: He is alert and oriented to person, place, and time. Mental status is at baseline.  Psychiatric:        Mood and Affect: Mood normal.        Behavior: Behavior normal.        Thought Content: Thought content normal.        Judgment: Judgment normal.

## 2024-03-20 NOTE — Telephone Encounter (Signed)
 FYI Only or Action Required?: FYI only for provider.  Patient was last seen in primary care on 05/10/2023 by Norleen Lynwood ORN, MD.  Called Nurse Triage reporting Diarrhea.  Symptoms began several weeks ago.  Interventions attempted: Nothing.  Symptoms are: unchanged.  Triage Disposition: See PCP When Office is Open (Within 3 Days)  Patient/caregiver understands and will follow disposition?: Yes     Copied from CRM #[id number]. Topic: Clinical - Red Word Triage >> Mar 20, 2024  8:17 AM Carlatta H wrote: [facility] that prompted transfer to Nurse Triage: Patient has had diarrhea for about 10day also sore throat//   ----------------------------------------------------------------------- From previous Reason for Contact - Scheduling: Patient/patient representative is calling to schedule an appointment. Refer to attachments for appointment information. Reason for Disposition  [1] MILD diarrhea (e.g., 1-3 or more stools than normal in past 24 hours) AND [2] present >  7 days  (Exception: Chronic diarrhea that is not worse.)  Answer Assessment - Initial Assessment Questions 1. DIARRHEA SEVERITY: How bad is the diarrhea? How many more stools have you had in the past 24 hours than normal?      2 2. ONSET: When did the diarrhea begin?      10 days ago 3. STOOL DESCRIPTION:  How loose or watery is the diarrhea? What is the stool color? Is there any blood or mucous in the stool?     loose 4. VOMITING: Are you also vomiting? If Yes, ask: How many times in the past 24 hours?      nausea 5. ABDOMEN PAIN: Are you having any abdomen pain? If Yes, ask: What does it feel like? (e.g., crampy, dull, intermittent, constant)      denies 6. ABDOMEN PAIN SEVERITY: If present, ask: How bad is the pain?  (e.g., Scale 1-10; mild, moderate, or severe)     denies 7. ORAL INTAKE: If vomiting, Have you been able to drink liquids? How much liquids have you had in the past 24 hours?      Can swallow 8. HYDRATION: Any signs of dehydration? (e.g., dry mouth [not just dry lips], too weak to stand, dizziness, new weight loss) When did you last urinate?     Dizzy when standing at times 9. EXPOSURE: Have you traveled to a foreign country recently? Have you been exposed to anyone with diarrhea? Could you have eaten any food that was spoiled?     denies 10. ANTIBIOTIC USE: Are you taking antibiotics now or have you taken antibiotics in the past 2 months?       Denies.  11. OTHER SYMPTOMS: Do you have any other symptoms? (e.g., fever, blood in stool)       Sore throat 12. PREGNANCY: Is there any chance you are pregnant? When was your last menstrual period?       na  Protocols used: [facility]

## 2024-04-26 ENCOUNTER — Telehealth: Payer: Self-pay

## 2024-04-26 NOTE — Telephone Encounter (Signed)
 Copied from CRM #[id number]. Topic: Clinical - Request for Lab/Test Order >> Apr 26, 2024  3:26 PM Anairis L wrote: Reason for CRM: Can someone please submit physical labs for patient.

## 2024-04-26 NOTE — Telephone Encounter (Signed)
 Ok to use orders already in place, thanks

## 2024-05-15 ENCOUNTER — Other Ambulatory Visit

## 2024-05-22 ENCOUNTER — Other Ambulatory Visit

## 2024-05-22 DIAGNOSIS — R739 Hyperglycemia, unspecified: Secondary | ICD-10-CM

## 2024-05-22 DIAGNOSIS — E785 Hyperlipidemia, unspecified: Secondary | ICD-10-CM | POA: Diagnosis not present

## 2024-05-22 DIAGNOSIS — E538 Deficiency of other specified B group vitamins: Secondary | ICD-10-CM | POA: Diagnosis not present

## 2024-05-22 DIAGNOSIS — Z125 Encounter for screening for malignant neoplasm of prostate: Secondary | ICD-10-CM

## 2024-05-22 DIAGNOSIS — E559 Vitamin D deficiency, unspecified: Secondary | ICD-10-CM | POA: Diagnosis not present

## 2024-05-22 LAB — CBC WITH DIFFERENTIAL/PLATELET
Basophils Absolute: 0 K/uL (ref 0.0–0.1)
Basophils Relative: 0.7 % (ref 0.0–3.0)
Eosinophils Absolute: 0.1 K/uL (ref 0.0–0.7)
Eosinophils Relative: 2 % (ref 0.0–5.0)
HCT: 41.9 % (ref 39.0–52.0)
Hemoglobin: 14.5 g/dL (ref 13.0–17.0)
Lymphocytes Relative: 19.6 % (ref 12.0–46.0)
Lymphs Abs: 1.4 K/uL (ref 0.7–4.0)
MCHC: 34.6 g/dL (ref 30.0–36.0)
MCV: 97.6 fl (ref 78.0–100.0)
Monocytes Absolute: 0.6 K/uL (ref 0.1–1.0)
Monocytes Relative: 9.1 % (ref 3.0–12.0)
Neutro Abs: 4.9 K/uL (ref 1.4–7.7)
Neutrophils Relative %: 68.6 % (ref 43.0–77.0)
Platelets: 195 K/uL (ref 150.0–400.0)
RBC: 4.3 Mil/uL (ref 4.22–5.81)
RDW: 12.8 % (ref 11.5–15.5)
WBC: 7.1 K/uL (ref 4.0–10.5)

## 2024-05-22 LAB — URINALYSIS, ROUTINE W REFLEX MICROSCOPIC
Bilirubin Urine: NEGATIVE
Hgb urine dipstick: NEGATIVE
Ketones, ur: NEGATIVE
Leukocytes,Ua: NEGATIVE
Nitrite: NEGATIVE
RBC / HPF: NONE SEEN (ref 0–?)
Specific Gravity, Urine: 1.015 (ref 1.000–1.030)
Total Protein, Urine: NEGATIVE
Urine Glucose: NEGATIVE
Urobilinogen, UA: 0.2 (ref 0.0–1.0)
pH: 7.5 (ref 5.0–8.0)

## 2024-05-22 LAB — HEPATIC FUNCTION PANEL
ALT: 26 U/L (ref 0–53)
AST: 32 U/L (ref 0–37)
Albumin: 4.4 g/dL (ref 3.5–5.2)
Alkaline Phosphatase: 49 U/L (ref 39–117)
Bilirubin, Direct: 0.1 mg/dL (ref 0.0–0.3)
Total Bilirubin: 0.7 mg/dL (ref 0.2–1.2)
Total Protein: 6.3 g/dL (ref 6.0–8.3)

## 2024-05-22 LAB — PSA: PSA: 2.35 ng/mL (ref 0.10–4.00)

## 2024-05-22 LAB — BASIC METABOLIC PANEL WITH GFR
BUN: 18 mg/dL (ref 6–23)
CO2: 31 meq/L (ref 19–32)
Calcium: 9.4 mg/dL (ref 8.4–10.5)
Chloride: 104 meq/L (ref 96–112)
Creatinine, Ser: 0.95 mg/dL (ref 0.40–1.50)
GFR: 82.74 mL/min (ref >60.00)
Glucose, Bld: 100 mg/dL — ABNORMAL HIGH (ref 70–99)
Potassium: 4.5 meq/L (ref 3.5–5.1)
Sodium: 141 meq/L (ref 135–145)

## 2024-05-22 LAB — LIPID PANEL
Cholesterol: 137 mg/dL (ref 0–200)
HDL: 66.4 mg/dL (ref >39.00)
LDL Cholesterol: 55 mg/dL (ref 0–99)
NonHDL: 70.68
Total CHOL/HDL Ratio: 2
Triglycerides: 78 mg/dL (ref 0.0–149.0)
VLDL: 15.6 mg/dL (ref 0.0–40.0)

## 2024-05-22 LAB — MICROALBUMIN / CREATININE URINE RATIO
Creatinine,U: 107.4 mg/dL
Microalb Creat Ratio: UNDETERMINED mg/g (ref 0.0–30.0)
Microalb, Ur: <0.7 mg/dL

## 2024-05-22 LAB — VITAMIN D 25 HYDROXY (VIT D DEFICIENCY, FRACTURES): VITD: 28.87 ng/mL — ABNORMAL LOW (ref 30.00–100.00)

## 2024-05-22 LAB — HEMOGLOBIN A1C: Hgb A1c MFr Bld: 5.6 % (ref 4.6–6.5)

## 2024-05-22 LAB — VITAMIN B12: Vitamin B-12: 318 pg/mL (ref 211–911)

## 2024-05-22 LAB — TSH: TSH: 1.28 u[IU]/mL (ref 0.35–5.50)

## 2024-05-25 ENCOUNTER — Other Ambulatory Visit: Payer: Self-pay | Admitting: Internal Medicine

## 2024-05-25 ENCOUNTER — Ambulatory Visit: Admitting: Internal Medicine

## 2024-05-25 ENCOUNTER — Encounter: Payer: Self-pay | Admitting: Internal Medicine

## 2024-05-25 VITALS — BP 136/82 | HR 68 | Temp 98.5°F | Ht 70.0 in | Wt 182.0 lb

## 2024-05-25 DIAGNOSIS — E538 Deficiency of other specified B group vitamins: Secondary | ICD-10-CM

## 2024-05-25 DIAGNOSIS — L609 Nail disorder, unspecified: Secondary | ICD-10-CM | POA: Insufficient documentation

## 2024-05-25 DIAGNOSIS — E559 Vitamin D deficiency, unspecified: Secondary | ICD-10-CM

## 2024-05-25 DIAGNOSIS — Z0001 Encounter for general adult medical examination with abnormal findings: Secondary | ICD-10-CM

## 2024-05-25 DIAGNOSIS — Z0000 Encounter for general adult medical examination without abnormal findings: Secondary | ICD-10-CM

## 2024-05-25 DIAGNOSIS — R739 Hyperglycemia, unspecified: Secondary | ICD-10-CM

## 2024-05-25 DIAGNOSIS — R931 Abnormal findings on diagnostic imaging of heart and coronary circulation: Secondary | ICD-10-CM

## 2024-05-25 DIAGNOSIS — Z125 Encounter for screening for malignant neoplasm of prostate: Secondary | ICD-10-CM

## 2024-05-25 DIAGNOSIS — E782 Mixed hyperlipidemia: Secondary | ICD-10-CM

## 2024-05-25 DIAGNOSIS — R972 Elevated prostate specific antigen [PSA]: Secondary | ICD-10-CM

## 2024-05-25 DIAGNOSIS — Z23 Encounter for immunization: Secondary | ICD-10-CM

## 2024-05-25 DIAGNOSIS — J309 Allergic rhinitis, unspecified: Secondary | ICD-10-CM

## 2024-05-25 DIAGNOSIS — I10 Essential (primary) hypertension: Secondary | ICD-10-CM

## 2024-05-25 MED ORDER — LOSARTAN POTASSIUM 50 MG PO TABS: 50.0000 mg | ORAL_TABLET | Freq: Every day | ORAL | 3 refills | Status: AC

## 2024-05-25 MED ORDER — CELECOXIB 200 MG PO CAPS: 200.0000 mg | ORAL_CAPSULE | Freq: Two times a day (BID) | ORAL | 3 refills | Status: AC

## 2024-05-25 MED ORDER — CIALIS 5 MG PO TABS: 5.0000 mg | ORAL_TABLET | Freq: Every day | ORAL | 3 refills | Status: DC

## 2024-05-25 MED ORDER — ATORVASTATIN CALCIUM 40 MG PO TABS: 40.0000 mg | ORAL_TABLET | Freq: Every day | ORAL | 3 refills | Status: AC

## 2024-05-25 MED ORDER — TAMSULOSIN HCL 0.4 MG PO CAPS: ORAL_CAPSULE | ORAL | 3 refills | Status: AC

## 2024-05-25 NOTE — Patient Instructions (Addendum)
 You had the flu shot today  Please take OTC Vitamin D3 at 2000 units per day, indefinitely  Please continue all other medications as before, and refills have been done if requested.  Please have the pharmacy call with any other refills you may need.  Please continue your efforts at being more active, low cholesterol diet, and weight control.  You are otherwise up to date with prevention measures today.  Please keep your appointments with your specialists as you may have planned  Your lab work was Excellent!  Please make an Appointment to return for your 1 year visit, or sooner if needed, with Lab testing by Appointment as well, to be done about 3-5 days before at the FIRST FLOOR Lab (so this is for TWO appointments - please see the scheduling desk as you leave)

## 2024-05-25 NOTE — Progress Notes (Signed)
 Patient ID: Peter Leblanc, male   DOB: October 17, 1955, 68 y.o.   MRN: [id number]         Chief Complaint:: wellness exam and low vit d, psa increased, hyperglycemia, htn, hld, allergies       HPI:  Peter Leblanc is a 68 y.o. male here for wellness exam; for flu shot, up to date                Also Pt denies chest pain, increased sob or doe, wheezing, orthopnea, PND, increased LE swelling, palpitations, dizziness or syncope.   Pt denies polydipsia, polyuria, or new focal neuro s/s.    Pt denies fever, wt loss, night sweats, loss of appetite, or other constitutional symptoms  Denies urinary symptoms such as dysuria, frequency, urgency, flank pain, hematuria or n/v, fever, chills.    Wt Readings from Last 3 Encounters:  05/25/24 182 lb (82.6 kg)  03/20/24 181 lb (82.1 kg)  09/21/23 179 lb (81.2 kg)   BP Readings from Last 3 Encounters:  05/25/24 136/82  03/20/24 136/78  08/24/23 118/74   Immunization History  Administered Date(s) Administered   Fluad Trivalent(High Dose 65+) 05/10/2023   INFLUENZA, HIGH DOSE SEASONAL PF 05/25/2024   Influenza Inj Mdck Quad Pf 03/31/2018   Influenza Whole 06/25/1997   Influenza,inj,Quad PF,6+ Mos 02/14/2014, 02/19/2019, 05/02/2021   Influenza-Unspecified 03/08/2015, 04/02/2020   PFIZER(Purple Top)SARS-COV-2 Vaccination 09/09/2019, 09/23/2019, 06/27/2020   PNEUMOCOCCAL CONJUGATE-20 05/06/2022   Td 11/29/2009   Tdap 05/02/2021   Zoster Recombinant(Shingrix ) 05/28/2021, 10/08/2021   There are no preventive care reminders to display for this patient.     Past Medical History:  Diagnosis Date   ALLERGIC RHINITIS 02/15/2007   Allergy    Anxiety 08/23/2011   BPH (benign prostatic hypertrophy) with urinary obstruction 11/16/2012   CONJUNCTIVITIS, INFECTIOUS 09/04/2008   ERECTILE DYSFUNCTION 09/26/2008   Genital herpes    HTN (hypertension) 12/06/2015   HYPERLIPIDEMIA 11/08/2007   KNEE PAIN, BILATERAL 11/29/2009   PSA, INCREASED 11/08/2007   SKIN LESION 11/29/2009    Slowing of urinary stream 09/04/2008   TIA (transient ischemic attack) 12/06/2015   VULVAR ABSCESS 11/27/2009   Past Surgical History:  Procedure Laterality Date   COLONOSCOPY  12/02/2006   w/Brodie    GANGLION CYST EXCISION     NASAL SINUS SURGERY  1997   PROSTATE BIOPSY     TONSILLECTOMY     VASECTOMY      reports that he has never smoked. He has never used smokeless tobacco. He reports current alcohol use of about 14.0 standard drinks of alcohol per week. He reports that he does not use drugs. He was adopted. Family history is unknown by patient. No Known Allergies Current Outpatient Medications on File Prior to Visit  Medication Sig Dispense Refill   aspirin  EC 81 MG tablet Take 81 mg by mouth 3 (three) times a week.     finasteride (PROSCAR) 5 MG tablet Take 5 mg by mouth at bedtime.     NON FORMULARY Take 1-2 g by mouth See admin instructions. Eat 1-2 grams of freshly-ground black pepper in the morning and evening     TURMERIC PO Take 1 capsule by mouth See admin instructions. Take 1 capsule by mouth in the morning and evening     No current facility-administered medications on file prior to visit.        ROS:  All others reviewed and negative.  Objective        PE:  BP  136/82 (BP Location: Right Arm, Patient Position: Sitting, Cuff Size: Normal)   Pulse 68   Temp 98.5 F (36.9 C) (Oral)   Ht 5' 10 (1.778 m)   Wt 182 lb (82.6 kg)   SpO2 98%   BMI 26.11 kg/m                 Constitutional: Pt appears in NAD               HENT: Head: NCAT.                Right Ear: External ear normal.                 Left Ear: External ear normal.                Eyes: . Pupils are equal, round, and reactive to light. Conjunctivae and EOM are normal               Nose: without d/c or deformity               Neck: Neck supple. Gross normal ROM               Cardiovascular: Normal rate and regular rhythm.                 Pulmonary/Chest: Effort normal and breath sounds without rales  or wheezing.                Abd:  Soft, NT, ND, + BS, no organomegaly               Neurological: Pt is alert. At baseline orientation, motor grossly intact               Skin: Skin is warm. No rashes, no other new lesions, LE edema - none               Psychiatric: Pt behavior is normal without agitation   Micro: none  Cardiac tracings I have personally interpreted today:  none  Pertinent Radiological findings (summarize): none   Lab Results  Component Value Date   WBC 7.1 05/22/2024   HGB 14.5 05/22/2024   HCT 41.9 05/22/2024   PLT 195.0 05/22/2024   GLUCOSE 100 (H) 05/22/2024   CHOL 137 05/22/2024   TRIG 78.0 05/22/2024   HDL 66.40 05/22/2024   LDLDIRECT 86.0 12/04/2020   LDLCALC 55 05/22/2024   ALT 26 05/22/2024   AST 32 05/22/2024   NA 141 05/22/2024   K 4.5 05/22/2024   CL 104 05/22/2024   CREATININE 0.95 05/22/2024   BUN 18 05/22/2024   CO2 31 05/22/2024   TSH 1.28 05/22/2024   PSA 2.35 05/22/2024   INR 0.9 06/11/2023   HGBA1C 5.6 05/22/2024   MICROALBUR <0.7 05/22/2024   Assessment/Plan:  Peter Leblanc is a 68 y.o. White or Caucasian [1] male with  has a past medical history of ALLERGIC RHINITIS (02/15/2007), Allergy, Anxiety (08/23/2011), BPH (benign prostatic hypertrophy) with urinary obstruction (11/16/2012), CONJUNCTIVITIS, INFECTIOUS (09/04/2008), ERECTILE DYSFUNCTION (09/26/2008), Genital herpes, HTN (hypertension) (12/06/2015), HYPERLIPIDEMIA (11/08/2007), KNEE PAIN, BILATERAL (11/29/2009), PSA, INCREASED (11/08/2007), SKIN LESION (11/29/2009), Slowing of urinary stream (09/04/2008), TIA (transient ischemic attack) (12/06/2015), and VULVAR ABSCESS (11/27/2009).  Preventative health care Age and sex appropriate education and counseling updated with regular exercise and diet Referrals for preventative services - none needed Immunizations addressed - for flu shot today Smoking counseling  - none needed Evidence for depression or other mood  disorder - none significant Most  recent labs reviewed. I have personally reviewed and have noted: 1) the patient's medical and social history 2) The patient's current medications and supplements 3) The patient's height, weight, and BMI have been recorded in the chart   Vitamin D  deficiency Last vitamin D  Lab Results  Component Value Date   VD25OH 28.87 (L) 05/22/2024   Low, to start oral replacement   PSA, INCREASED Lab Results  Component Value Date   PSA 2.35 05/22/2024   PSA 3.32 01/29/2023   PSA 5.13 (H) 05/01/2022    Improved, stable,  to f/u any worsening symptoms or concerns  Hyperglycemia Lab Results  Component Value Date   HGBA1C 5.6 05/22/2024   Stable, pt to continue current medical treatment  - diet,wt control   HTN (hypertension) BP Readings from Last 3 Encounters:  05/25/24 136/82  03/20/24 136/78  08/24/23 118/74   Stable, pt to continue medical treatment losartan  50 mg qd   HLD (hyperlipidemia) Lab Results  Component Value Date   LDLCALC 55 05/22/2024   Stable, pt to continue current statin lipitor 40 mg qd   Allergic rhinitis Mild, ok for otc allegra and /or nasacort  asd  Followup: Return in about 1 year (around 05/25/2025).  Lynwood Rush, MD 05/28/2024 1:21 PM  [facility]

## 2024-05-28 ENCOUNTER — Encounter: Payer: Self-pay | Admitting: Internal Medicine

## 2024-05-28 NOTE — Assessment & Plan Note (Signed)
 Last vitamin D  Lab Results  Component Value Date   VD25OH 28.87 (L) 05/22/2024   Low, to start oral replacement

## 2024-05-28 NOTE — Assessment & Plan Note (Signed)
Age and sex appropriate education and counseling updated with regular exercise and diet Referrals for preventative services - none needed Immunizations addressed - for flu shot today Smoking counseling  - none needed Evidence for depression or other mood disorder - none significant Most recent labs reviewed. I have personally reviewed and have noted: 1) the patient's medical and social history 2) The patient's current medications and supplements 3) The patient's height, weight, and BMI have been recorded in the chart  

## 2024-05-28 NOTE — Assessment & Plan Note (Signed)
 BP Readings from Last 3 Encounters:  05/25/24 136/82  03/20/24 136/78  08/24/23 118/74   Stable, pt to continue medical treatment losartan  50 mg qd

## 2024-05-28 NOTE — Assessment & Plan Note (Signed)
 Lab Results  Component Value Date   LDLCALC 55 05/22/2024   Stable, pt to continue current statin lipitor 40 mg qd

## 2024-05-28 NOTE — Assessment & Plan Note (Signed)
 Lab Results  Component Value Date   PSA 2.35 05/22/2024   PSA 3.32 01/29/2023   PSA 5.13 (H) 05/01/2022    Improved, stable,  to f/u any worsening symptoms or concerns

## 2024-05-28 NOTE — Assessment & Plan Note (Signed)
 Mild, ok for otc allegra and /or nasacort  asd

## 2024-05-28 NOTE — Assessment & Plan Note (Signed)
 Lab Results  Component Value Date   HGBA1C 5.6 05/22/2024   Stable, pt to continue current medical treatment  - diet,wt control

## 2024-05-28 NOTE — Addendum Note (Signed)
 Addended by: NORLEEN LYNWOOD ORN on: 05/28/2024 01:24 PM   Modules accepted: Orders
